# Patient Record
Sex: Female | Born: 1980 | Race: White | Hispanic: No | Smoking: Never smoker
Health system: Southern US, Community
[De-identification: ages and names within clinical notes are randomized; demographics above are authoritative.]

## PROBLEM LIST (undated history)

## (undated) DIAGNOSIS — F3181 Bipolar II disorder: Secondary | ICD-10-CM

## (undated) DIAGNOSIS — J309 Allergic rhinitis, unspecified: Secondary | ICD-10-CM

## (undated) HISTORY — DX: Allergic rhinitis, unspecified: J30.9

## (undated) HISTORY — PX: DENTAL SURGERY: SHX609

## (undated) HISTORY — DX: Bipolar II disorder: F31.81

## (undated) HISTORY — PX: OTHER SURGICAL HISTORY: SHX169

---

## 1999-09-22 ENCOUNTER — Emergency Department (HOSPITAL_COMMUNITY): Admission: EM | Admit: 1999-09-22 | Discharge: 1999-09-22 | Payer: Self-pay | Admitting: Emergency Medicine

## 1999-09-22 ENCOUNTER — Encounter: Payer: Self-pay | Admitting: Emergency Medicine

## 2009-08-02 ENCOUNTER — Encounter: Admission: RE | Admit: 2009-08-02 | Discharge: 2009-08-02 | Payer: Self-pay | Admitting: Family Medicine

## 2011-04-12 ENCOUNTER — Emergency Department: Payer: Self-pay | Admitting: Emergency Medicine

## 2011-04-12 IMAGING — CR RIGHT FOREARM - 2 VIEW
1 series · 2 of 2 positions shown · non-contrast
Comparison: none

REASON FOR EXAM: r/o FB, laceration
COMMENTS:   May transport without cardiac monitor

PROCEDURE:     DXR - DXR FOREARM RIGHT  - [DATE] [DATE]
RESULT:     Comparison: None.

[Series 1: view not recorded · 0.17mm/px · 2 of 2 slices shown]
[im 1/2]
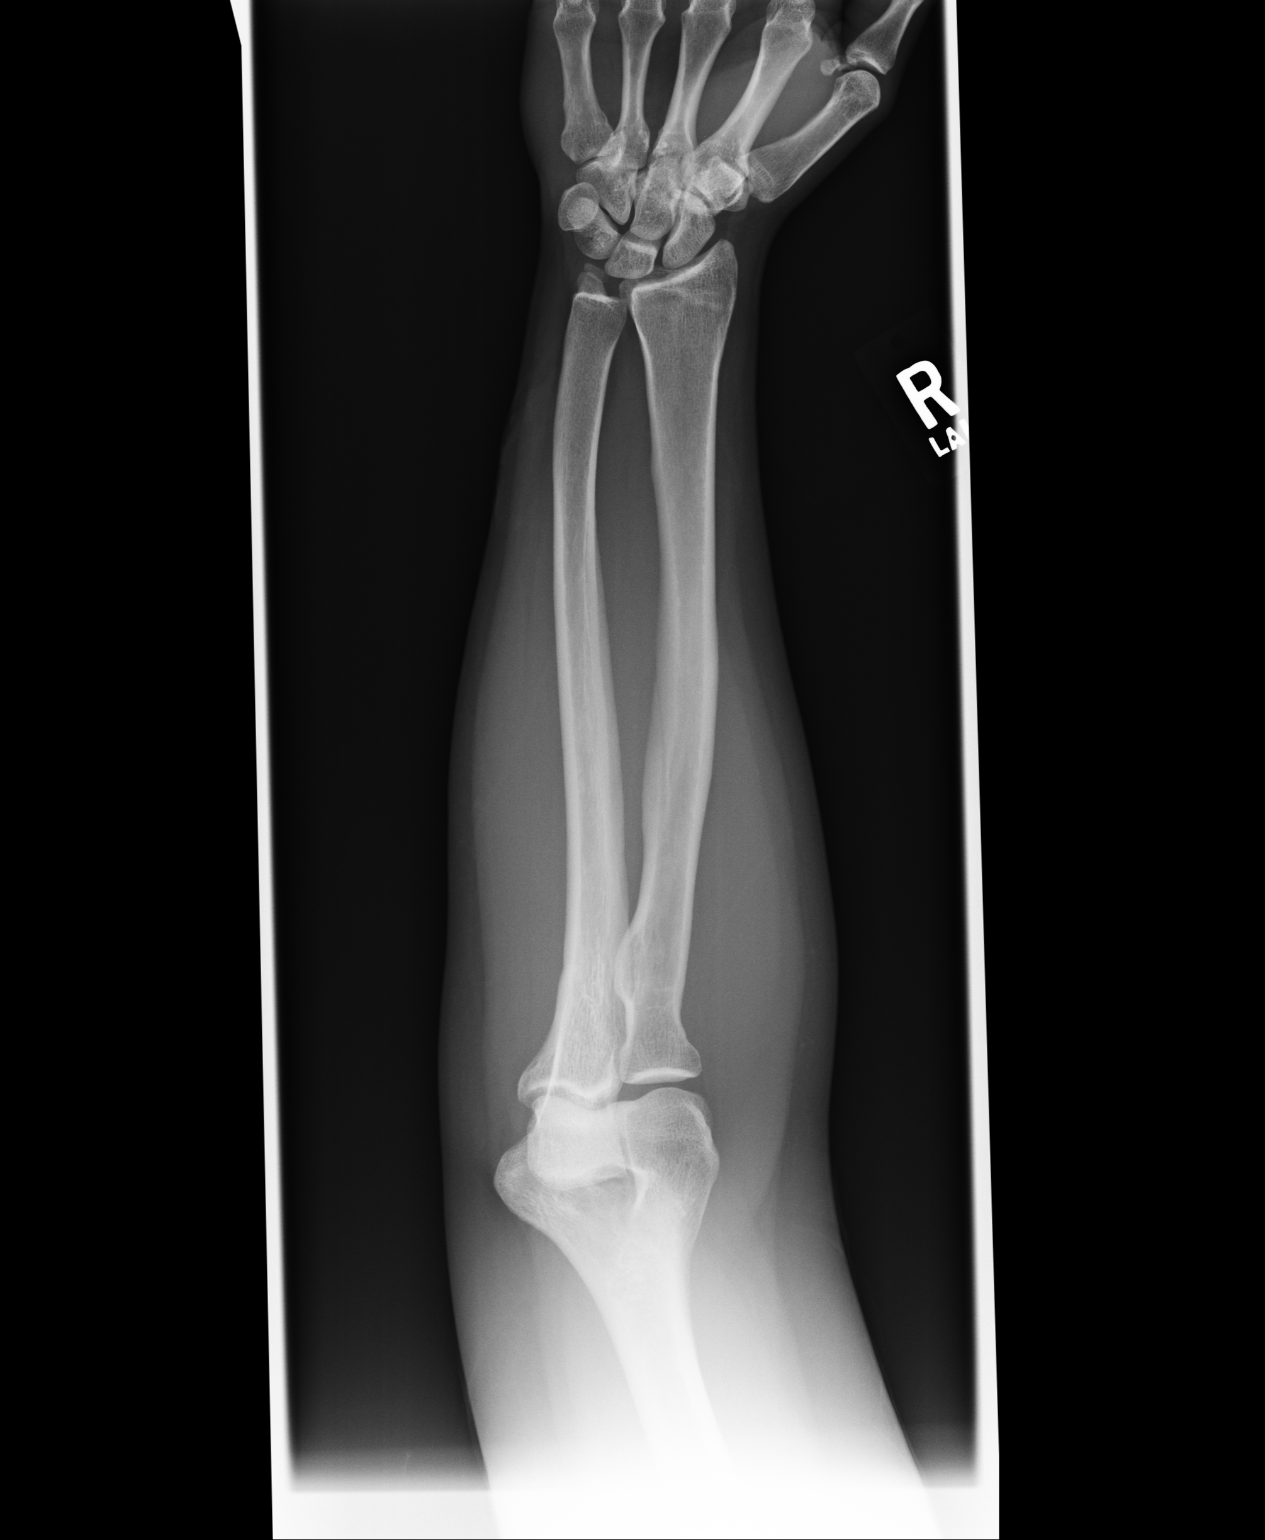
[im 2/2]
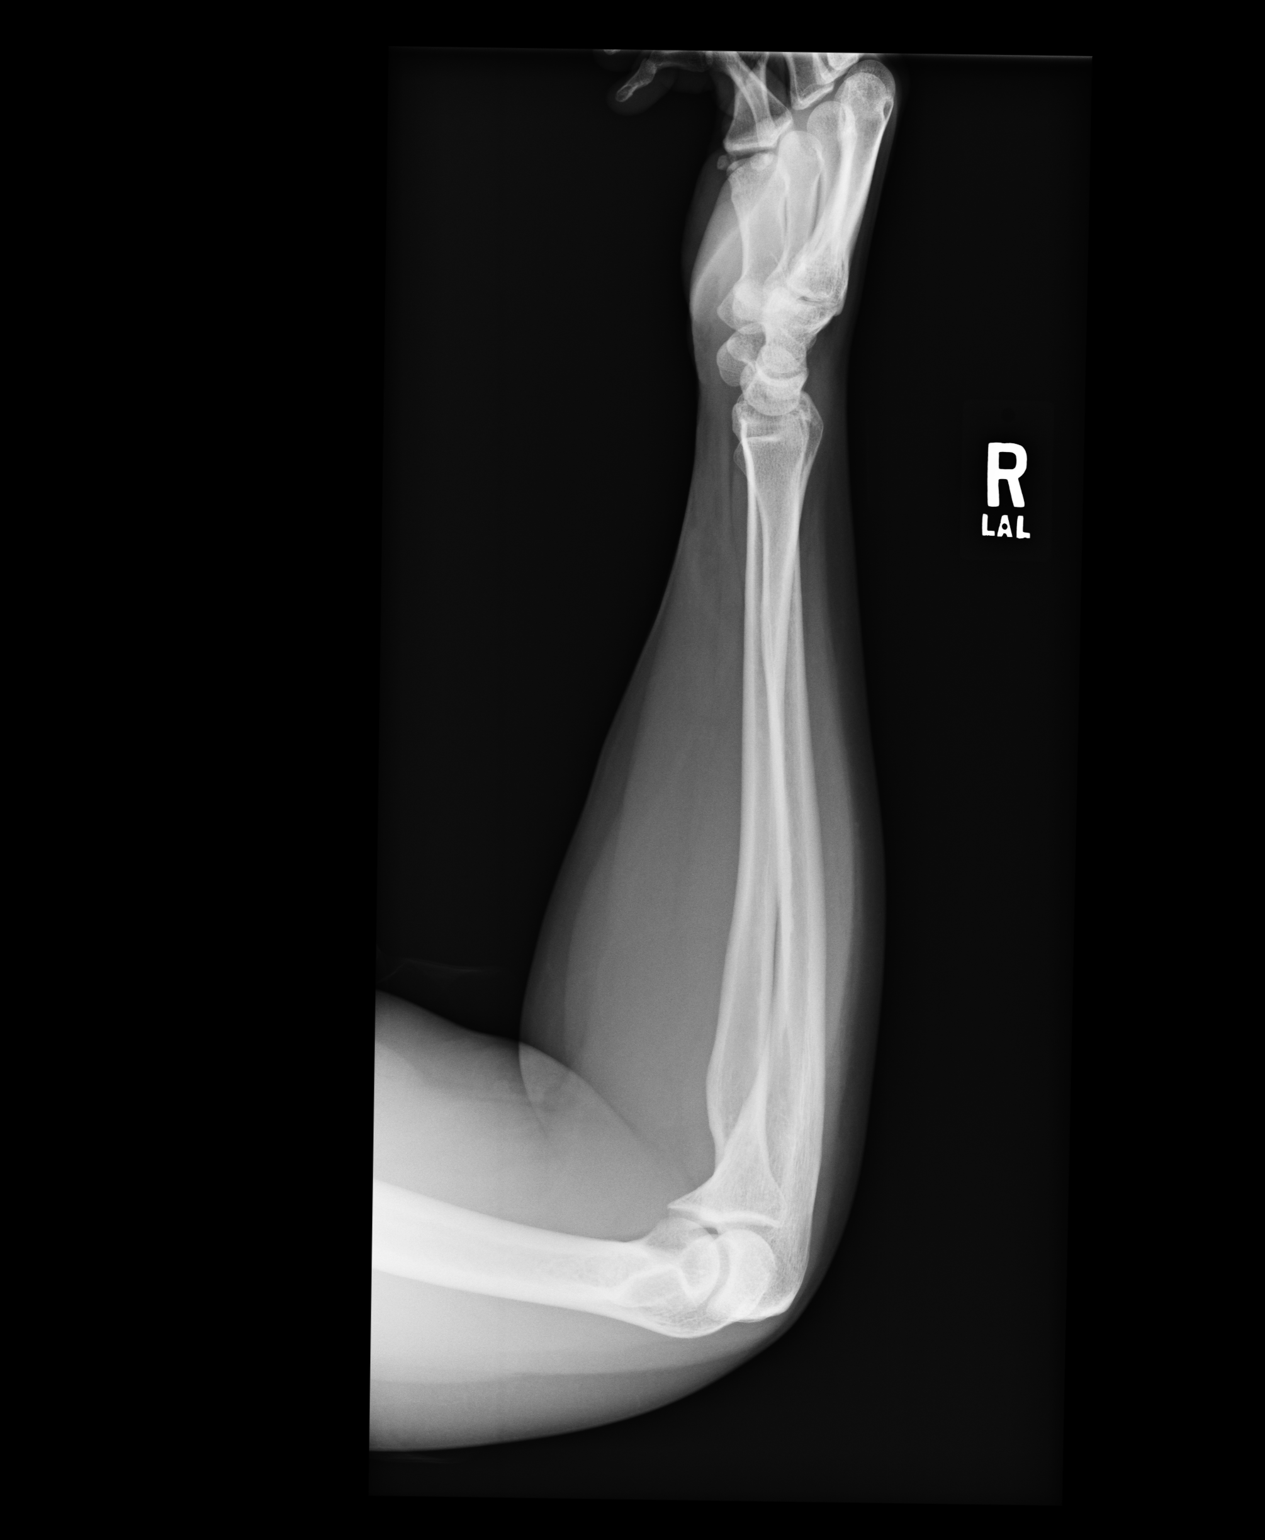

[2 of 2 positions shown; findings below may reference images not displayed]

FINDINGS: No radiopaque foreign body identified. No acute fracture seen. Round
sclerotic density distal to the ulnar styloid likely sequela of old prior
trauma.
IMPRESSION: No fracture or radiopaque foreign body identified.

## 2016-09-16 ENCOUNTER — Ambulatory Visit (INDEPENDENT_AMBULATORY_CARE_PROVIDER_SITE_OTHER): Payer: BLUE CROSS/BLUE SHIELD | Admitting: Pulmonary Disease

## 2016-09-16 ENCOUNTER — Encounter: Payer: Self-pay | Admitting: Pulmonary Disease

## 2016-09-16 VITALS — BP 122/68 | HR 72 | Ht 63.0 in | Wt 178.0 lb

## 2016-09-16 DIAGNOSIS — J45909 Unspecified asthma, uncomplicated: Secondary | ICD-10-CM | POA: Diagnosis not present

## 2016-09-16 DIAGNOSIS — R0609 Other forms of dyspnea: Secondary | ICD-10-CM

## 2016-09-16 DIAGNOSIS — R0600 Dyspnea, unspecified: Secondary | ICD-10-CM

## 2016-09-16 MED ORDER — FLUTICASONE FUROATE-VILANTEROL 100-25 MCG/INH IN AEPB: 1.0000 | INHALATION_SPRAY | Freq: Every day | RESPIRATORY_TRACT | 0 refills | Status: AC

## 2016-09-16 MED ORDER — MONTELUKAST SODIUM 10 MG PO TABS: 10.0000 mg | ORAL_TABLET | Freq: Every day | ORAL | 11 refills | Status: DC

## 2016-09-16 NOTE — Patient Instructions (Addendum)
Sample of Breo - one inhalation daily Singulair 10 mg daily until follow up Continue albuterol inhaler prior to exertion I have ordered CXR and echocardiogram Follow up in 3-4 weeks. If no better, consider cardiopulmonary stress test

## 2016-09-16 NOTE — Progress Notes (Signed)
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 She has noted DOE since age 59 or 76 and was diagnosed with exercise induced asthma. She was prescribed an albuterol inhaler but never really felt much benefit from it. 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 She indicates that no matter how hard she trains, she seems to be limited by DOE. She was on Flovent and this was changed to Advair without improvement. Therefore, she has gone back to the [employer]. She notes little day to day variation in her symptoms though she does believe her symptoms are worse in the extremes of temperature and humidity. Denies CP, fever, purulent sputum, hemoptysis, LE edema and calf tenderness.    Past Medical History:  Diagnosis Date  . Allergic rhinitis   . Asthma     Past Surgical History:  Procedure Laterality Date  . birth mark removal    . DENTAL SURGERY      MEDICATIONS: I have reviewed all medications and confirmed regimen as documented  Social History   Social History  . Marital status: Married    Spouse name: N/A  . Number of children: N/A  . Years of education: N/A   Occupational History  . Not on file.   Social History Main Topics  . Smoking status: Never Smoker  . Smokeless tobacco: Never Used  . Alcohol use Yes     Comment: occ  . Drug use: No  . Sexual activity: Not on file   Other Topics Concern  . Not on file   Social History Narrative  . No narrative on file    Family History  Problem Relation Age of Onset  . Hypertension Mother   . Hypertension Father   . Breast cancer Maternal Grandmother   . Heart disease Paternal  Grandmother     ROS: No fever, myalgias/arthralgias, unexplained weight loss or weight gain No new focal weakness or sensory deficits No otalgia, hearing loss, visual changes, nasal and sinus symptoms, mouth and throat problems No neck pain or adenopathy No abdominal pain, N/V/D, diarrhea, change in bowel pattern No dysuria, change in urinary pattern   Vitals:   09/16/16 1002  BP: 122/68  Pulse: 72  SpO2: 100%  Weight: 178 lb (80.7 kg)  Height: 5\' 3"  (1.6 m)     EXAM:  Gen: WDWN, No overt respiratory distress HEENT: NCAT, sclera white, oropharynx normal Neck: Supple without LAN, thyromegaly, JVD Lungs: breath sounds: full, percussion: normal, No wheezes or other adventitious sounds Cardiovascular: RRR, no murmurs Abdomen: Soft, nontender, normal BS Ext: without clubbing, cyanosis, edema Neuro: CNs grossly intact, motor and sensory intact Skin: Limited exam, no lesions noted  DATA:  Outside CBC and chem panels reviewed - entirely normal  CXR:  N/A Office spirometry 09/16/16: entirely normal  IMPRESSION:     ICD-9-CM ICD-10-CM   1. DOE (dyspnea on exertion) 786.09 R06.09 Spirometry with graph     DG Chest 2 View  2. Asthma. possible 493.90 J45.909    The lack of response to proper asthma therapy calls into question the accuracy of that diagnosis. If this is exercise induced asthma, it should respond to ICS/LABA. Leukotriene inhibitors are  also effective in EIA  PLAN:  1) Breo 100/25 - one inhalation daily. Samples provided 2) Singulair 10 mg daily until follow up 3) Continue albuterol inhaler prior to exertion 4) I have ordered CXR and echocardiogram 5) Follow up in 3-4 weeks. If no better, consider cardiopulmonary stress test   Merton Border, MD PCCM service Mobile [phone] Pager [phone] 09/16/2016

## 2016-09-16 NOTE — Progress Notes (Signed)
Patient ID: Marilyn Francis, female   DOB: 08/24/1981, 35 y.o.   MRN: [id number] Patient seen in the office today and instructed on use of Breo Ellipta.  Patient expressed understanding and demonstrated technique.

## 2016-09-18 ENCOUNTER — Telehealth: Payer: Self-pay | Admitting: Pulmonary Disease

## 2016-09-18 NOTE — Telephone Encounter (Signed)
Pt would like a call regarding a comment that Dr. Alva Garnet documented in her note regarding her working. Please call.

## 2016-09-18 NOTE — Telephone Encounter (Signed)
Spoke with pt who states after her OV with DS on 09-16-16 a note was sent to her job and because of how it was worded, pt has been placed on restricted duty and would like to discuss this further with DS. Pt has been scheduled for 09-22-16 at 9am  With DS. Nothing further needed.  Will route to DS for a fyi.

## 2016-09-22 ENCOUNTER — Ambulatory Visit
Admission: RE | Admit: 2016-09-22 | Discharge: 2016-09-22 | Disposition: A | Discharge status: Home / Self Care | Payer: BLUE CROSS/BLUE SHIELD | Source: Ambulatory Visit | Attending: Pulmonary Disease | Admitting: Pulmonary Disease

## 2016-09-22 ENCOUNTER — Ambulatory Visit (INDEPENDENT_AMBULATORY_CARE_PROVIDER_SITE_OTHER): Payer: BLUE CROSS/BLUE SHIELD | Admitting: Pulmonary Disease

## 2016-09-22 ENCOUNTER — Encounter: Payer: Self-pay | Admitting: Pulmonary Disease

## 2016-09-22 VITALS — BP 130/70 | HR 78 | Ht 63.0 in | Wt 185.8 lb

## 2016-09-22 DIAGNOSIS — R05 Cough: Secondary | ICD-10-CM

## 2016-09-22 DIAGNOSIS — R0609 Other forms of dyspnea: Secondary | ICD-10-CM | POA: Diagnosis not present

## 2016-09-22 DIAGNOSIS — R0600 Dyspnea, unspecified: Secondary | ICD-10-CM

## 2016-09-22 DIAGNOSIS — R059 Cough, unspecified: Secondary | ICD-10-CM

## 2016-09-22 IMAGING — CR DG CHEST 2V
1 series · 2 of 2 positions shown · non-contrast
Comparison: None.

CLINICAL DATA: Shortness of breath with exertion

EXAM:
CHEST  2 VIEW

[Series 1: dg chest 2 view · 0.14mm/px · 2 of 2 slices shown]
[im 1/2]
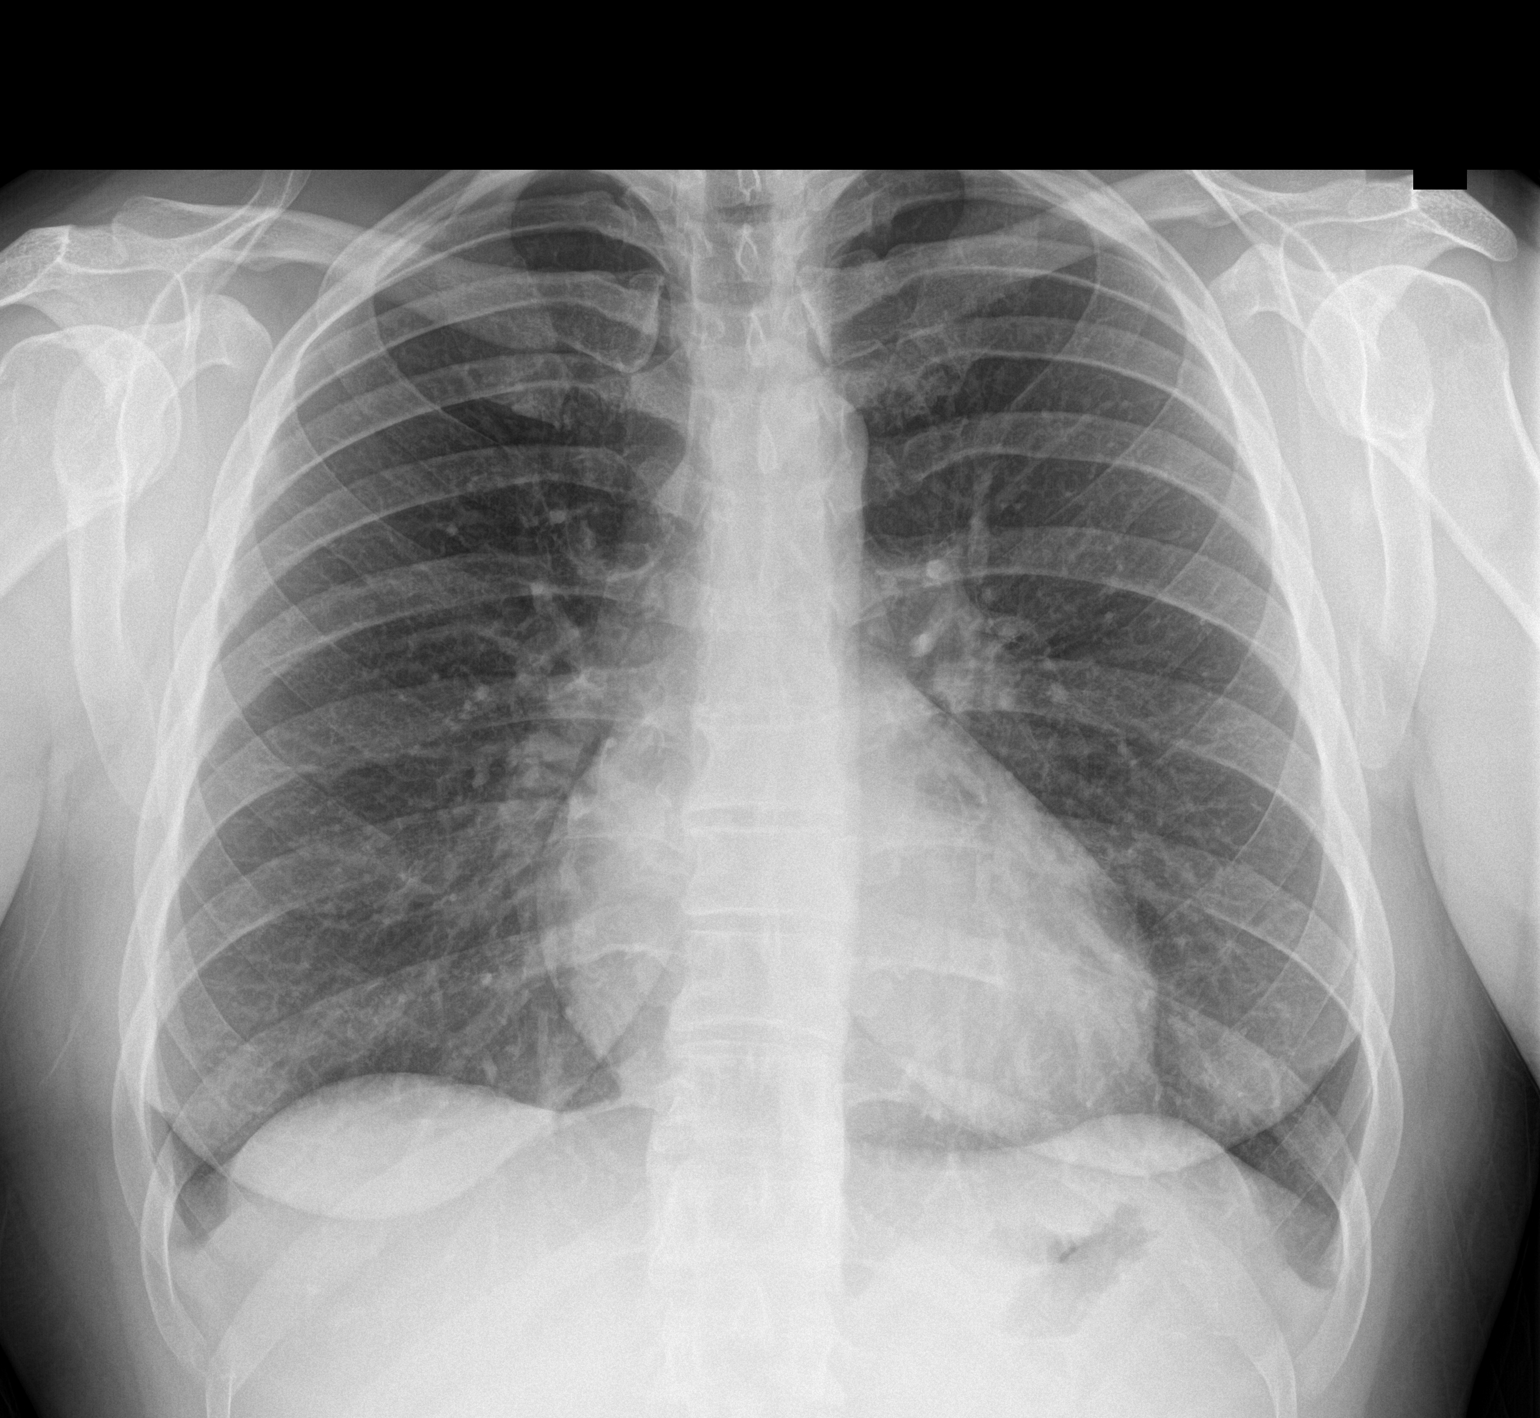
[im 2/2]
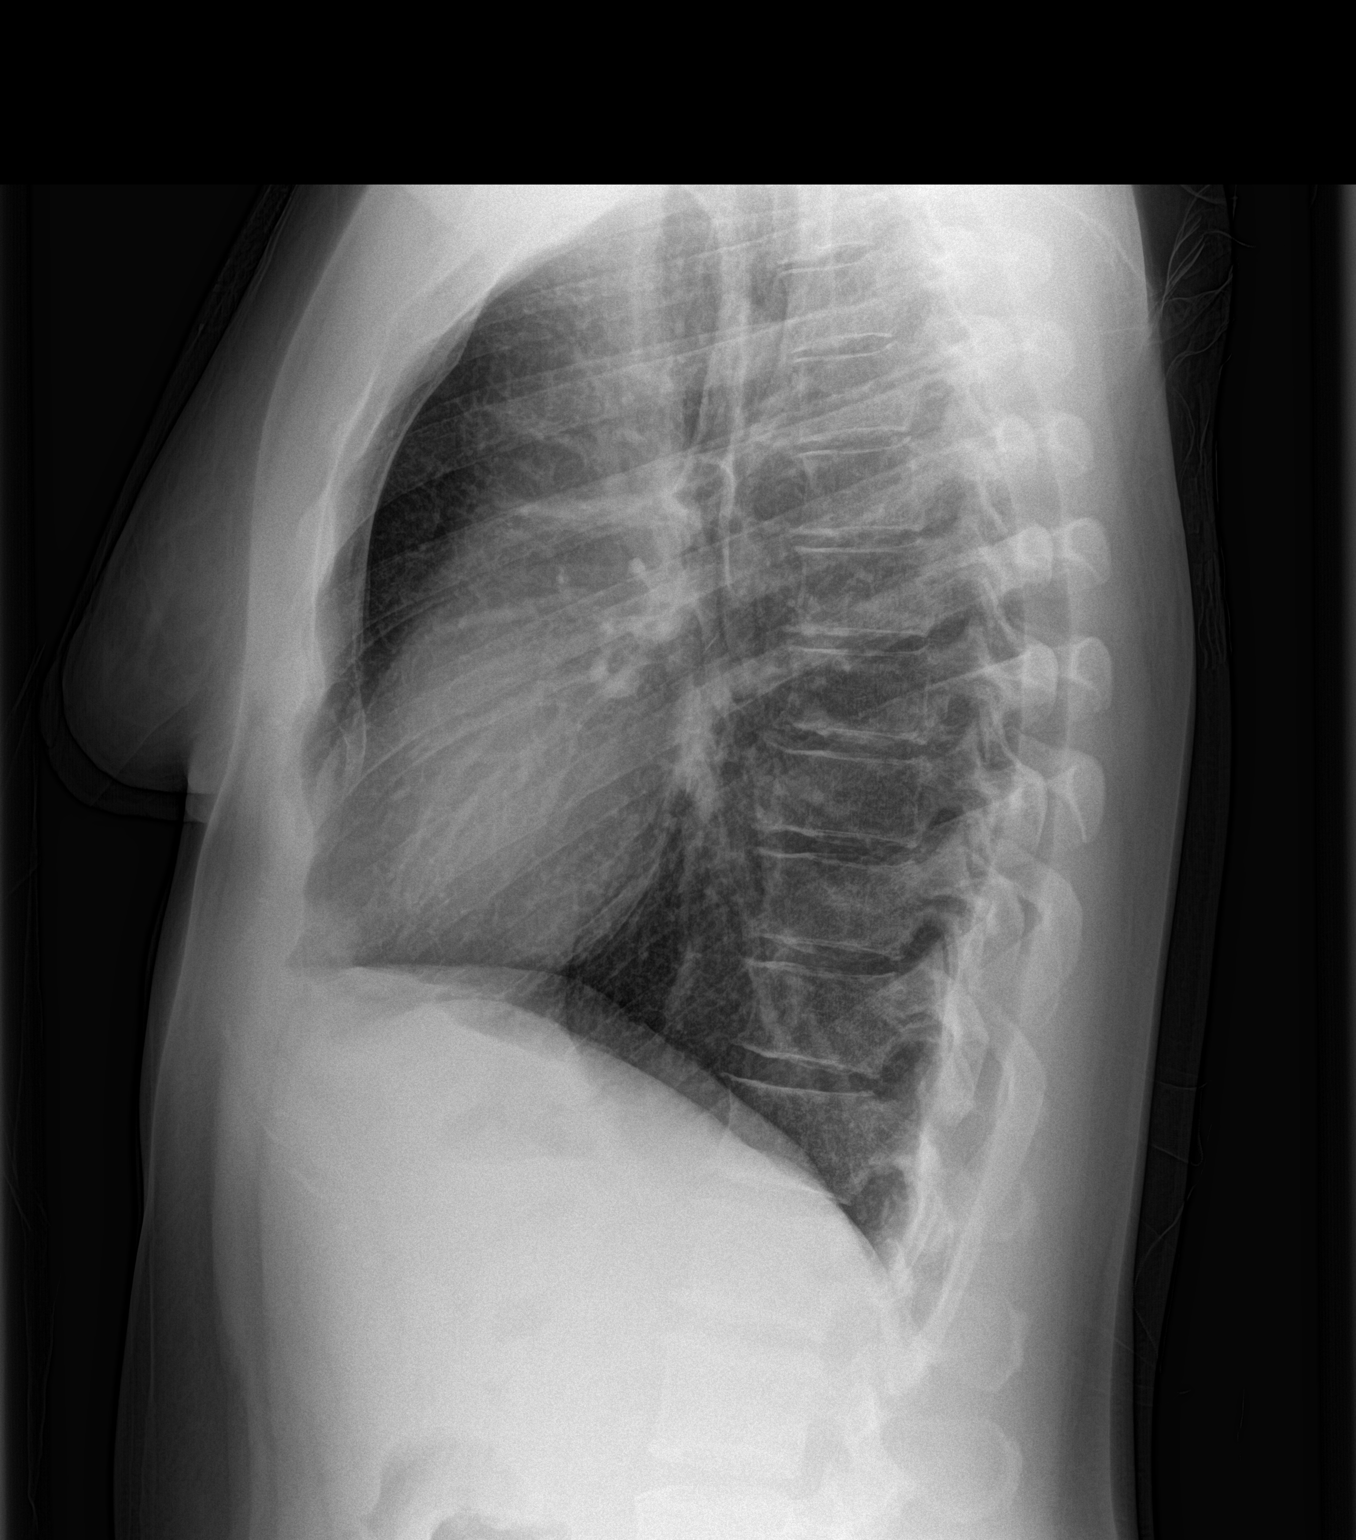

[2 of 2 positions shown; findings below may reference images not displayed]

FINDINGS: Lungs are clear. Heart size and pulmonary vascularity are normal. No
adenopathy. No bone lesions.
IMPRESSION: No edema or consolidation.

## 2016-09-22 NOTE — Progress Notes (Signed)
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 She has been treated with inhaled SABA but there has been minimal discernible benefit. 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   Office spirometry 09/16/16: entirely normal  SUBJ: Seen as an add-on today. She is concerned about the wording in my last note and reports that she has been on restricted duty due to the possible diagnosis of asthma and the notation re: limiting dyspnea. She emphasizes to me that she does not believe that her symptoms are preventing her from completing any of her duties. She has noted no change in her symptoms since starting Breo inhaler and montelukast. Denies CP, fever, purulent sputum, hemoptysis, LE edema and calf tenderness.  Vitals:   09/22/16 0848  BP: 130/70  Pulse: 78  SpO2: 98%  Weight: 185 lb 12.8 oz (84.3 kg)  Height: 5\' 3"  (1.6 m)     EXAM:  Gen: WDWN, No overt respiratory distress Neck: No, JVD Lungs: breath sounds full, no wheezes or other adventitious sounds Cardiovascular: RRR, no murmurs Abdomen: Soft, nontender, normal BS Ext: without clubbing, cyanosis, edema Neuro: grossly intact Skin: Limited exam, no lesions noted  DATA:   CXR:  pending   IMPRESSION:     ICD-9-CM ICD-10-CM   1. DOE (dyspnea on exertion) 786.09 R06.09 Cardiopulmonary exercise test  2. Cough due to ICS/LABA (Breo)  Exertional dyspnea remains incompletely explained. No discernible benefit from Breo inhaler and Singulair  PLAN:  1) DC Breo  2) Cont Singulair 10 mg daily until follow up 3) Continue albuterol inhaler prior to exertion 4) Previously ordered  CXR to be performed today 5) Echocardiogram already scheduled for 10/01/16 6) Cardiopulmonary stress test ordered with pre and post spirometry. This will measure her VO2 max, clarify the major limiting factor and assess for exercise induced bronchospasm 7) ROV already scheduled for 10/10/16  Merton Border, MD PCCM service Mobile [phone] Pager [phone] 09/22/2016

## 2016-09-22 NOTE — Patient Instructions (Signed)
1) Stop Breo inhaler 2) Continue Singulair (montelukast) 3) Continue Albuterol inhaler as needed and prior to vigorous exertion 4) Echocardiogram scheduled for 10/01/16 5) Cardiopulmonary stress test ([place]) 6) Chest Xray today 7) Follow up already scheduled for 10/10/16

## 2016-10-01 ENCOUNTER — Other Ambulatory Visit: Payer: Self-pay

## 2016-10-01 ENCOUNTER — Ambulatory Visit (INDEPENDENT_AMBULATORY_CARE_PROVIDER_SITE_OTHER): Payer: BLUE CROSS/BLUE SHIELD

## 2016-10-01 DIAGNOSIS — R0600 Dyspnea, unspecified: Secondary | ICD-10-CM

## 2016-10-01 DIAGNOSIS — R0609 Other forms of dyspnea: Secondary | ICD-10-CM

## 2016-10-01 LAB — ECHOCARDIOGRAM COMPLETE
AVLVOTPG: 6 mmHg
Ao-asc: 26 cm
CHL CUP DOP CALC LVOT VTI: 28 cm
CHL CUP MV DEC (S): 254
CHL CUP STROKE VOLUME: 67 mL
EERAT: 10.57
EWDT: 254 ms
FS: 41 % (ref 28–44)
IVS/LV PW RATIO, ED: 1.26
LA ID, A-P, ES: 35 mm
LA vol A4C: 52.5 ml
LA vol: 44.9 mL
LADIAMINDEX: 1.78 cm/m2
LAVOLIN: 22.8 mL/m2
LEFT ATRIUM END SYS DIAM: 35 mm
LV PW d: 8.65 mm — AB (ref 0.6–1.1)
LV SIMPSON'S DISK: 66
LV TDI E'MEDIAL: 10.9
LV dias vol index: 51 mL/m2
LV dias vol: 101 mL (ref 46–106)
LV e' LATERAL: 12.3 cm/s
LV sys vol index: 17 mL/m2
LVEEAVG: 10.57
LVEEMED: 10.57
LVOT area: 3.14 cm2
LVOT diameter: 20 mm
LVOTPV: 124 cm/s
LVOTSV: 88 mL
LVSYSVOL: 34 mL (ref 14–42)
MV pk A vel: 59.7 m/s
MVPG: 7 mmHg
MVPKEVEL: 130 m/s
TAPSE: 25.2 mm
TDI e' lateral: 12.3

## 2016-10-03 ENCOUNTER — Ambulatory Visit (HOSPITAL_COMMUNITY): Payer: BLUE CROSS/BLUE SHIELD | Attending: Pulmonary Disease

## 2016-10-03 DIAGNOSIS — R0609 Other forms of dyspnea: Secondary | ICD-10-CM | POA: Insufficient documentation

## 2016-10-03 DIAGNOSIS — R0600 Dyspnea, unspecified: Secondary | ICD-10-CM

## 2016-10-10 ENCOUNTER — Encounter: Payer: Self-pay | Admitting: Pulmonary Disease

## 2016-10-10 ENCOUNTER — Ambulatory Visit (INDEPENDENT_AMBULATORY_CARE_PROVIDER_SITE_OTHER): Payer: BLUE CROSS/BLUE SHIELD | Admitting: Pulmonary Disease

## 2016-10-10 VITALS — BP 118/68 | HR 73 | Ht 63.0 in | Wt 183.0 lb

## 2016-10-10 DIAGNOSIS — R0600 Dyspnea, unspecified: Secondary | ICD-10-CM | POA: Diagnosis not present

## 2016-10-10 DIAGNOSIS — R0609 Other forms of dyspnea: Secondary | ICD-10-CM

## 2016-10-12 NOTE — Progress Notes (Signed)
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 She has been treated with inhaled SABA but there has been minimal discernible benefit. 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   Office spirometry 09/16/16: entirely normal CXR 09/22/16: normal Echocardiogram 10/01/16:  LVEF 60-65%, mild MR, mild TR CPST 10/03/16: Supranormal exercise tolerance (VO2 max 136% predicted), normal cardiopulmonary response to exercise. No exercise induced bronchospasm  SUBJ: No new complaints. Here to review results of echocardiogram and CPST  Vitals:   10/10/16 1016  BP: 118/68  Pulse: 73  SpO2: 100%  Weight: 183 lb (83 kg)  Height: 5\' 3"  (1.6 m)     EXAM:  Gen: WDWN, No overt respiratory distress Neck: No, JVD Lungs: breath sounds full Cardiovascular: RRR, no murmurs Abdomen: Soft, nontender, normal BS Ext: without clubbing, cyanosis, edema Neuro: grossly intact Skin: Limited exam, no lesions noted  DATA:   CXR:    IMPRESSION:   Unexplained dyspnea - a thorough investigation reveals no evidence of cardiac or pulmonary disease or impairment. There is no evidence of asthma or exercise-induced asthma. Her exercise tolerance is supra-normal and her cardiopulmonary response to exercise is normal  She is frustrated with the lack of a diagnosis. Her symptoms are real but without explanation.   PLAN:  I explained that we have not uncovered an explanation for her symptoms. It is highly unlikely that further investigation would be any more enlightening, especially in light of her supra-normal VO2 max. There is no indication to continue any of the  trial therapies (that have done nothing to alleviate her symptoms). There is no medical reason for any restrictions in the course of her work or other activities  Follow up PRN  Merton Border, MD PCCM service Mobile [phone] Pager [phone] 10/12/2016

## 2017-03-27 ENCOUNTER — Ambulatory Visit (INDEPENDENT_AMBULATORY_CARE_PROVIDER_SITE_OTHER): Payer: BLUE CROSS/BLUE SHIELD | Admitting: Family Medicine

## 2017-03-27 ENCOUNTER — Encounter: Payer: Self-pay | Admitting: Family Medicine

## 2017-03-27 VITALS — BP 109/71 | HR 60 | Temp 98.3°F | Ht 63.1 in | Wt 182.4 lb

## 2017-03-27 DIAGNOSIS — Z1329 Encounter for screening for other suspected endocrine disorder: Secondary | ICD-10-CM | POA: Diagnosis not present

## 2017-03-27 DIAGNOSIS — F3181 Bipolar II disorder: Secondary | ICD-10-CM | POA: Diagnosis not present

## 2017-03-27 DIAGNOSIS — Z79899 Other long term (current) drug therapy: Secondary | ICD-10-CM | POA: Diagnosis not present

## 2017-03-27 DIAGNOSIS — J309 Allergic rhinitis, unspecified: Secondary | ICD-10-CM | POA: Insufficient documentation

## 2017-03-27 DIAGNOSIS — Z13220 Encounter for screening for lipoid disorders: Secondary | ICD-10-CM

## 2017-03-27 MED ORDER — LAMOTRIGINE 100 MG PO TABS: 100.0000 mg | ORAL_TABLET | Freq: Every day | ORAL | 1 refills | Status: DC

## 2017-03-27 NOTE — Progress Notes (Signed)
BP 109/71 (BP Location: Left Arm, Patient Position: Sitting, Cuff Size: Large)   Pulse 60   Temp 98.3 F (36.8 C)   Ht 5' 3.1" (1.603 m)   Wt 182 lb 6.4 oz (82.7 kg)   LMP 03/15/2017 (Exact Date)   SpO2 97%   BMI 32.21 kg/m    Subjective:    Patient ID: Marilyn Francis, female    DOB: 08-27-1981, 36 y.o.   MRN: [id number]  HPI: Marilyn Francis is a 36 y.o. female who presents today to establish care  Chief Complaint  Patient presents with  . Establish Care  . Medication Refill    Lamactil, patient states that her previous mental health provider has moved away so she has to find a new one. She is currently stable on her medication and would like a refill on this.    BIPOLAR- Diagnosed in 2005, Has been on stable dose Mood status: controlled Satisfied with current treatment?: yes Symptom severity: mild  Duration of current treatment : months Side effects: no Medication compliance: excellent compliance Psychotherapy/counseling: yes in the past Previous psychiatric medications: lamictal Depressed mood: no Anxious mood: no Anhedonia: no Significant weight loss or gain: no Insomnia: no  Fatigue: no Feelings of worthlessness or guilt: no Impaired concentration/indecisiveness: no Suicidal ideations: no Hopelessness: no Crying spells: no Depression screen PHQ 2/9 03/27/2017  Decreased Interest 0  Down, Depressed, Hopeless 0  PHQ - 2 Score 0   Allergies stable- having some sniffling. Using Neti-pot  Active Ambulatory Problems    Diagnosis Date Noted  . Bipolar 2 disorder ([place])   . Allergic rhinitis    Resolved Ambulatory Problems    Diagnosis Date Noted  . No Resolved Ambulatory Problems   Past Medical History:  Diagnosis Date  . Allergic rhinitis   . Bipolar 2 disorder [facility])    Past Surgical History:  Procedure Laterality Date  . birth mark removal    . DENTAL SURGERY     Outpatient Encounter Prescriptions as of 03/27/2017  Medication Sig Note  .  Calcium Carbonate-Vit D-Min (CALCIUM 1200 PO) Take by mouth.   . cholecalciferol (VITAMIN D) 1000 units tablet Take 1,000 Units by mouth daily.   . cetirizine (ZYRTEC) 10 MG tablet Take 10 mg by mouth daily.   Marland Kitchen ibuprofen (ADVIL,MOTRIN) 600 MG tablet Take 600 mg by mouth every 6 (six) hours as needed.   . lamoTRIgine (LAMICTAL) 100 MG tablet Take 1 tablet (100 mg total) by mouth daily.   . Magnesium 200 MG TABS Take 1 tablet by mouth daily.   . Multiple Vitamin (MULTIVITAMIN) tablet Take 1 tablet by mouth daily.   . [DISCONTINUED] lamoTRIgine (LAMICTAL) 100 MG tablet  09/16/2016: Received from: External Pharmacy  . [DISCONTINUED] montelukast (SINGULAIR) 10 MG tablet Take 1 tablet (10 mg total) by mouth daily.    No facility-administered encounter medications on file as of 03/27/2017.    No Known Allergies Social History   Social History  . Marital status: Married    Spouse name: N/A  . Number of children: N/A  . Years of education: N/A   Occupational History  . Not on file.   Social History Main Topics  . Smoking status: Never Smoker  . Smokeless tobacco: Never Used  . Alcohol use Yes     Comment: occ  . Drug use: No  . Sexual activity: Yes    Birth control/ protection: None   Other Topics Concern  . Not on file   Social History  Narrative  . No narrative on file   Family History  Problem Relation Age of Onset  . Hypertension Mother   . Hypertension Father   . Breast cancer Maternal Grandmother   . Heart disease Paternal Grandmother   . Cancer Paternal Grandfather     Colon    Review of Systems  Constitutional: Negative.   HENT: Positive for postnasal drip, rhinorrhea and sneezing. Negative for congestion, dental problem, drooling, ear discharge, ear pain, facial swelling, hearing loss, mouth sores, nosebleeds, sinus pain, sinus pressure, sore throat, tinnitus, trouble swallowing and voice change.   Respiratory: Negative.   Cardiovascular: Negative.     Psychiatric/Behavioral: Negative.     Per HPI unless specifically indicated above     Objective:    BP 109/71 (BP Location: Left Arm, Patient Position: Sitting, Cuff Size: Large)   Pulse 60   Temp 98.3 F (36.8 C)   Ht 5' 3.1" (1.603 m)   Wt 182 lb 6.4 oz (82.7 kg)   LMP 03/15/2017 (Exact Date)   SpO2 97%   BMI 32.21 kg/m   Wt Readings from Last 3 Encounters:  03/27/17 182 lb 6.4 oz (82.7 kg)  10/10/16 183 lb (83 kg)  09/22/16 185 lb 12.8 oz (84.3 kg)    Physical Exam  Constitutional: She is oriented to person, place, and time. She appears well-developed and well-nourished. No distress.  HENT:  Head: Normocephalic and atraumatic.  Right Ear: Hearing normal.  Left Ear: Hearing normal.  Nose: Nose normal.  Eyes: Conjunctivae and lids are normal. Right eye exhibits no discharge. Left eye exhibits no discharge. No scleral icterus.  Cardiovascular: Normal rate, regular rhythm, normal heart sounds and intact distal pulses.  Exam reveals no gallop and no friction rub.   No murmur heard. Pulmonary/Chest: Effort normal and breath sounds normal. No respiratory distress. She has no wheezes. She has no rales. She exhibits no tenderness.  Musculoskeletal: Normal range of motion.  Neurological: She is alert and oriented to person, place, and time.  Skin: Skin is warm, dry and intact. No rash noted. She is not diaphoretic. No erythema. No pallor.  Psychiatric: She has a normal mood and affect. Her speech is normal and behavior is normal. Judgment and thought content normal. Cognition and memory are normal.  Nursing note and vitals reviewed.   Results for orders placed or performed in visit on 10/01/16  ECHOCARDIOGRAM COMPLETE  Result Value Ref Range   LV PW d 8.65 (A) 0.6 - 1.1 mm   FS 41 28 - 44 %   LA vol 44.9 mL   Ao-asc 26 cm   LA ID, A-P, ES 35 mm   IVS/LV PW RATIO, ED 1.26    Stroke v 67 ml   LVOT VTI 28 cm   LV e' LATERAL 12.3 cm/s   LV E/e' medial 10.57    LV  E/e'average 10.57    LA diam index 1.78 cm/m2   LA vol A4C 52.5 ml   LVOT peak grad rest 6 mmHg   E decel time 254 msec   LVOT diameter 20 mm   LVOT area 3.14 cm2   LVOT peak vel 124 cm/s   LVOT SV 88.00 mL   Peak grad 7 mmHg   E/e' ratio 10.57    MV pk E vel 130 m/s   MV pk A vel 59.7 m/s   LV sys vol 34 14 - 42 mL   LV sys vol index 17.0 mL/m2   LV dias  vol 101 46 - 106 mL   LV dias vol index 51.0 mL/m2   LA vol index 22.8 mL/m2   MV Dec 254    LA diam end sys 35.00 mm   Simpson's disk 66.00    TDI e' medial 10.90    TDI e' lateral 12.30    TAPSE 25.20 mm      Assessment & Plan:   Problem List Items Addressed This Visit      Respiratory   Allergic rhinitis    Stable on current regimen. Does not want to take flonase right now. Call with any concerns.         Other   Bipolar 2 disorder ([place]) - Primary    Well controlled. Will bridge her medicine now and check lamictal levels. Referral to Dr. Nicolasa Ducking put in today.      Relevant Orders   Lamotrigine level    Other Visit Diagnoses    Long-term use of high-risk medication       Lamictal levels drawn today.   Relevant Orders   CBC with Differential/Platelet   Comprehensive metabolic panel   TSH   UA/M w/rflx Culture, Routine   Lamotrigine level   Screening for thyroid disorder       Checking labs. Await results.   Relevant Orders   TSH   Screening for cholesterol level       Checking labs. Await results.   Relevant Orders   Lipid Panel w/o Chol/HDL Ratio       Follow up plan: Return Physical, for Records release [facility] for pap please.

## 2017-03-27 NOTE — Assessment & Plan Note (Signed)
Well controlled. Will bridge her medicine now and check lamictal levels. Referral to Dr. Nicolasa Ducking put in today.

## 2017-03-27 NOTE — Assessment & Plan Note (Signed)
Stable on current regimen. Does not want to take flonase right now. Call with any concerns.

## 2017-03-30 ENCOUNTER — Encounter: Payer: Self-pay | Admitting: Family Medicine

## 2017-03-30 ENCOUNTER — Ambulatory Visit (INDEPENDENT_AMBULATORY_CARE_PROVIDER_SITE_OTHER): Payer: BLUE CROSS/BLUE SHIELD | Admitting: Family Medicine

## 2017-03-30 VITALS — BP 125/82 | HR 64 | Temp 98.4°F | Wt 184.4 lb

## 2017-03-30 DIAGNOSIS — Z0000 Encounter for general adult medical examination without abnormal findings: Secondary | ICD-10-CM | POA: Diagnosis not present

## 2017-03-30 DIAGNOSIS — Z124 Encounter for screening for malignant neoplasm of cervix: Secondary | ICD-10-CM

## 2017-03-30 LAB — LIPID PANEL W/O CHOL/HDL RATIO
CHOLESTEROL TOTAL: 173 mg/dL (ref 100–199)
HDL: 79 mg/dL (ref >39)
LDL Calculated: 81 mg/dL (ref 0–99)
TRIGLYCERIDES: 63 mg/dL (ref 0–149)
VLDL CHOLESTEROL CAL: 13 mg/dL (ref 5–40)

## 2017-03-30 LAB — UA/M W/RFLX CULTURE, ROUTINE
Bilirubin, UA: NEGATIVE
Glucose, UA: NEGATIVE
Ketones, UA: NEGATIVE
Leukocytes, UA: NEGATIVE
NITRITE UA: NEGATIVE
PH UA: 7 (ref 5.0–7.5)
Protein, UA: NEGATIVE
RBC UA: NEGATIVE
Specific Gravity, UA: 1.02 (ref 1.005–1.030)
UUROB: 0.2 mg/dL (ref 0.2–1.0)

## 2017-03-30 LAB — CBC WITH DIFFERENTIAL/PLATELET
BASOS ABS: 0 10*3/uL (ref 0.0–0.2)
Basos: 0 %
EOS (ABSOLUTE): 0.2 10*3/uL (ref 0.0–0.4)
Eos: 2 %
Hematocrit: 39 % (ref 34.0–46.6)
Hemoglobin: 13.2 g/dL (ref 11.1–15.9)
IMMATURE GRANS (ABS): 0 10*3/uL (ref 0.0–0.1)
Immature Granulocytes: 0 %
LYMPHS: 27 %
Lymphocytes Absolute: 1.7 10*3/uL (ref 0.7–3.1)
MCH: 31.7 pg (ref 26.6–33.0)
MCHC: 33.8 g/dL (ref 31.5–35.7)
MCV: 94 fL (ref 79–97)
Monocytes Absolute: 0.3 10*3/uL (ref 0.1–0.9)
Monocytes: 5 %
Neutrophils Absolute: 4.1 10*3/uL (ref 1.4–7.0)
Neutrophils: 66 %
PLATELETS: 209 10*3/uL (ref 150–379)
RBC: 4.17 x10E6/uL (ref 3.77–5.28)
RDW: 13.3 % (ref 12.3–15.4)
WBC: 6.2 10*3/uL (ref 3.4–10.8)

## 2017-03-30 LAB — COMPREHENSIVE METABOLIC PANEL
A/G RATIO: 2.3 — AB (ref 1.2–2.2)
ALT: 8 IU/L (ref 0–32)
AST: 19 IU/L (ref 0–40)
Albumin: 4.3 g/dL (ref 3.5–5.5)
Alkaline Phosphatase: 47 IU/L (ref 39–117)
BILIRUBIN TOTAL: 0.2 mg/dL (ref 0.0–1.2)
BUN/Creatinine Ratio: 17 (ref 9–23)
BUN: 18 mg/dL (ref 6–20)
CHLORIDE: 103 mmol/L (ref 96–106)
CO2: 25 mmol/L (ref 18–29)
Calcium: 9.4 mg/dL (ref 8.7–10.2)
Creatinine, Ser: 1.06 mg/dL — ABNORMAL HIGH (ref 0.57–1.00)
GFR calc non Af Amer: 68 mL/min/{1.73_m2} (ref >59)
GFR, EST AFRICAN AMERICAN: 78 mL/min/{1.73_m2} (ref >59)
GLUCOSE: 92 mg/dL (ref 65–99)
Globulin, Total: 1.9 g/dL (ref 1.5–4.5)
Potassium: 4.7 mmol/L (ref 3.5–5.2)
Sodium: 140 mmol/L (ref 134–144)
TOTAL PROTEIN: 6.2 g/dL (ref 6.0–8.5)

## 2017-03-30 LAB — MICROSCOPIC EXAMINATION: WBC UA: NONE SEEN /HPF (ref 0–?)

## 2017-03-30 LAB — TSH: TSH: 2.79 u[IU]/mL (ref 0.450–4.500)

## 2017-03-30 LAB — URINE CULTURE, REFLEX

## 2017-03-30 LAB — LAMOTRIGINE LEVEL: LAMOTRIGINE LVL: 1.5 ug/mL — AB (ref 2.0–20.0)

## 2017-03-30 NOTE — Patient Instructions (Addendum)
Health Maintenance, Female Adopting a healthy lifestyle and getting preventive care can go a long way to promote health and wellness. Talk with your health care provider about what schedule of regular examinations is right for you. This is a good chance for you to check in with your provider about disease prevention and staying healthy. In between checkups, there are plenty of things you can do on your own. Experts have done a lot of research about which lifestyle changes and preventive measures are most likely to keep you healthy. Ask your health care provider for more information. Weight and diet Eat a healthy diet  Be sure to include plenty of vegetables, fruits, low-fat dairy products, and lean protein.  Do not eat a lot of foods high in solid fats, added sugars, or salt.  Get regular exercise. This is one of the most important things you can do for your health.  Most adults should exercise for at least 150 minutes each week. The exercise should increase your heart rate and make you sweat (moderate-intensity exercise).  Most adults should also do strengthening exercises at least twice a week. This is in addition to the moderate-intensity exercise. Maintain a healthy weight  Body mass index (BMI) is a measurement that can be used to identify possible weight problems. It estimates body fat based on height and weight. Your health care provider can help determine your BMI and help you achieve or maintain a healthy weight.  For females 76 years of age and older:  A BMI below 18.5 is considered underweight.  A BMI of 18.5 to 24.9 is normal.  A BMI of 25 to 29.9 is considered overweight.  A BMI of 30 and above is considered obese. Watch levels of cholesterol and blood lipids  You should start having your blood tested for lipids and cholesterol at 37 years of age, then have this test every 5 years.  You may need to have your cholesterol levels checked more often if:  Your lipid or  cholesterol levels are high.  You are older than 37 years of age.  You are at high risk for heart disease. Cancer screening Lung Cancer  Lung cancer screening is recommended for adults 64-42 years old who are at high risk for lung cancer because of a history of smoking.  A yearly low-dose CT scan of the lungs is recommended for people who:  Currently smoke.  Have quit within the past 15 years.  Have at least a 30-pack-year history of smoking. A pack year is smoking an average of one pack of cigarettes a day for 1 year.  Yearly screening should continue until it has been 15 years since you quit.  Yearly screening should stop if you develop a health problem that would prevent you from having lung cancer treatment. Breast Cancer  Practice breast self-awareness. This means understanding how your breasts normally appear and feel.  It also means doing regular breast self-exams. Let your health care provider know about any changes, no matter how small.  If you are in your 20s or 30s, you should have a clinical breast exam (CBE) by a health care provider every 1-3 years as part of a regular health exam.  If you are 34 or older, have a CBE every year. Also consider having a breast X-ray (mammogram) every year.  If you have a family history of breast cancer, talk to your health care provider about genetic screening.  If you are at high risk for breast cancer, talk  to your health care provider about having an MRI and a mammogram every year.  Breast cancer gene (BRCA) assessment is recommended for women who have family members with BRCA-related cancers. BRCA-related cancers include:  Breast.  Ovarian.  Tubal.  Peritoneal cancers.  Results of the assessment will determine the need for genetic counseling and BRCA1 and BRCA2 testing. Cervical Cancer  Your health care provider may recommend that you be screened regularly for cancer of the pelvic organs (ovaries, uterus, and vagina).  This screening involves a pelvic examination, including checking for microscopic changes to the surface of your cervix (Pap test). You may be encouraged to have this screening done every 3 years, beginning at age 24.  For women ages 66-65, health care providers may recommend pelvic exams and Pap testing every 3 years, or they may recommend the Pap and pelvic exam, combined with testing for human papilloma virus (HPV), every 5 years. Some types of HPV increase your risk of cervical cancer. Testing for HPV may also be done on women of any age with unclear Pap test results.  Other health care providers may not recommend any screening for nonpregnant women who are considered low risk for pelvic cancer and who do not have symptoms. Ask your health care provider if a screening pelvic exam is right for you.  If you have had past treatment for cervical cancer or a condition that could lead to cancer, you need Pap tests and screening for cancer for at least 20 years after your treatment. If Pap tests have been discontinued, your risk factors (such as having a new sexual partner) need to be reassessed to determine if screening should resume. Some women have medical problems that increase the chance of getting cervical cancer. In these cases, your health care provider may recommend more frequent screening and Pap tests. Colorectal Cancer  This type of cancer can be detected and often prevented.  Routine colorectal cancer screening usually begins at 37 years of age and continues through 37 years of age.  Your health care provider may recommend screening at an earlier age if you have risk factors for colon cancer.  Your health care provider may also recommend using home test kits to check for hidden blood in the stool.  A small camera at the end of a tube can be used to examine your colon directly (sigmoidoscopy or colonoscopy). This is done to check for the earliest forms of colorectal cancer.  Routine  screening usually begins at age 41.  Direct examination of the colon should be repeated every 5-10 years through 37 years of age. However, you may need to be screened more often if early forms of precancerous polyps or small growths are found. Skin Cancer  Check your skin from head to toe regularly.  Tell your health care provider about any new moles or changes in moles, especially if there is a change in a mole's shape or color.  Also tell your health care provider if you have a mole that is larger than the size of a pencil eraser.  Always use sunscreen. Apply sunscreen liberally and repeatedly throughout the day.  Protect yourself by wearing long sleeves, pants, a wide-brimmed hat, and sunglasses whenever you are outside. Heart disease, diabetes, and high blood pressure  High blood pressure causes heart disease and increases the risk of stroke. High blood pressure is more likely to develop in:  People who have blood pressure in the high end of the normal range (130-139/85-89 mm Hg).  People who are overweight or obese.  People who are African American.  If you are 59-24 years of age, have your blood pressure checked every 3-5 years. If you are 34 years of age or older, have your blood pressure checked every year. You should have your blood pressure measured twice-once when you are at a hospital or clinic, and once when you are not at a hospital or clinic. Record the average of the two measurements. To check your blood pressure when you are not at a hospital or clinic, you can use:  An automated blood pressure machine at a pharmacy.  A home blood pressure monitor.  If you are between 29 years and 60 years old, ask your health care provider if you should take aspirin to prevent strokes.  Have regular diabetes screenings. This involves taking a blood sample to check your fasting blood sugar level.  If you are at a normal weight and have a low risk for diabetes, have this test once  every three years after 37 years of age.  If you are overweight and have a high risk for diabetes, consider being tested at a younger age or more often. Preventing infection Hepatitis B  If you have a higher risk for hepatitis B, you should be screened for this virus. You are considered at high risk for hepatitis B if:  You were born in a country where hepatitis B is common. Ask your health care provider which countries are considered high risk.  Your parents were born in a high-risk country, and you have not been immunized against hepatitis B (hepatitis B vaccine).  You have HIV or AIDS.  You use needles to inject street drugs.  You live with someone who has hepatitis B.  You have had sex with someone who has hepatitis B.  You get hemodialysis treatment.  You take certain medicines for conditions, including cancer, organ transplantation, and autoimmune conditions. Hepatitis C  Blood testing is recommended for:  Everyone born from [birth year] through 1966.  Anyone with known risk factors for hepatitis C. Sexually transmitted infections (STIs)  You should be screened for sexually transmitted infections (STIs) including gonorrhea and chlamydia if:  You are sexually active and are younger than 37 years of age.  You are older than 37 years of age and your health care provider tells you that you are at risk for this type of infection.  Your sexual activity has changed since you were last screened and you are at an increased risk for chlamydia or gonorrhea. Ask your health care provider if you are at risk.  If you do not have HIV, but are at risk, it may be recommended that you take a prescription medicine daily to prevent HIV infection. This is called pre-exposure prophylaxis (PrEP). You are considered at risk if:  You are sexually active and do not regularly use condoms or know the HIV status of your partner(s).  You take drugs by injection.  You are sexually active with a partner  who has HIV. Talk with your health care provider about whether you are at high risk of being infected with HIV. If you choose to begin PrEP, you should first be tested for HIV. You should then be tested every 3 months for as long as you are taking PrEP. Pregnancy  If you are premenopausal and you may become pregnant, ask your health care provider about preconception counseling.  If you may become pregnant, take 400 to 800 micrograms (mcg) of folic acid  every day.  If you want to prevent pregnancy, talk to your health care provider about birth control (contraception). Osteoporosis and menopause  Osteoporosis is a disease in which the bones lose minerals and strength with aging. This can result in serious bone fractures. Your risk for osteoporosis can be identified using a bone density scan.  If you are 4 years of age or older, or if you are at risk for osteoporosis and fractures, ask your health care provider if you should be screened.  Ask your health care provider whether you should take a calcium or vitamin D supplement to lower your risk for osteoporosis.  Menopause may have certain physical symptoms and risks.  Hormone replacement therapy may reduce some of these symptoms and risks. Talk to your health care provider about whether hormone replacement therapy is right for you. Follow these instructions at home:  Schedule regular health, dental, and eye exams.  Stay current with your immunizations.  Do not use any tobacco products including cigarettes, chewing tobacco, or electronic cigarettes.  If you are pregnant, do not drink alcohol.  If you are breastfeeding, limit how much and how often you drink alcohol.  Limit alcohol intake to no more than 1 drink per day for nonpregnant women. One drink equals 12 ounces of beer, 5 ounces of wine, or 1 ounces of hard liquor.  Do not use street drugs.  Do not share needles.  Ask your health care provider for help if you need support  or information about quitting drugs.  Tell your health care provider if you often feel depressed.  Tell your health care provider if you have ever been abused or do not feel safe at home. This information is not intended to replace advice given to you by your health care provider. Make sure you discuss any questions you have with your health care provider. Document Released: 06/09/2011 Document Revised: 05/01/2016 Document Reviewed: 08/28/2015 Elsevier Interactive Patient Education  2017 [employer].

## 2017-03-30 NOTE — Progress Notes (Signed)
BP 125/82 (BP Location: Left Arm, Patient Position: Sitting, Cuff Size: Large)   Pulse 64   Temp 98.4 F (36.9 C)   Wt 184 lb 6.4 oz (83.6 kg)   LMP 03/15/2017 (Exact Date)   SpO2 100%   BMI 32.56 kg/m    Subjective:    Patient ID: Marilyn Francis, female    DOB: 1981/03/21, 36 y.o.   MRN: [id number]  HPI: Marilyn Francis is a 36 y.o. female presenting on 03/30/2017 for comprehensive medical examination. Current medical complaints include:none  She currently lives with: wife Menopausal Symptoms: no  Depression Screen done today and results listed below:  Depression screen PHQ 2/9 03/27/2017  Decreased Interest 0  Down, Depressed, Hopeless 0  PHQ - 2 Score 0    Past Medical History:  Past Medical History:  Diagnosis Date  . Allergic rhinitis   . Bipolar 2 disorder [facility]     Surgical History:  Past Surgical History:  Procedure Laterality Date  . birth mark removal    . DENTAL SURGERY      Medications:  Current Outpatient Prescriptions on File Prior to Visit  Medication Sig  . Calcium Carbonate-Vit D-Min (CALCIUM 1200 PO) Take by mouth.  . cetirizine (ZYRTEC) 10 MG tablet Take 10 mg by mouth daily.  . cholecalciferol (VITAMIN D) 1000 units tablet Take 1,000 Units by mouth daily.  Marland Kitchen ibuprofen (ADVIL,MOTRIN) 600 MG tablet Take 600 mg by mouth every 6 (six) hours as needed.  . lamoTRIgine (LAMICTAL) 100 MG tablet Take 1 tablet (100 mg total) by mouth daily.  . Magnesium 200 MG TABS Take 1 tablet by mouth daily.  . Multiple Vitamin (MULTIVITAMIN) tablet Take 1 tablet by mouth daily.   No current facility-administered medications on file prior to visit.     Allergies:  No Known Allergies  Social History:  Social History   Social History  . Marital status: Married    Spouse name: N/A  . Number of children: N/A  . Years of education: N/A   Occupational History  . Not on file.   Social History Main Topics  . Smoking status: Never Smoker  . Smokeless  tobacco: Never Used  . Alcohol use Yes     Comment: occ  . Drug use: No  . Sexual activity: Yes    Birth control/ protection: None   Other Topics Concern  . Not on file   Social History Narrative  . No narrative on file   History  Smoking Status  . Never Smoker  Smokeless Tobacco  . Never Used   History  Alcohol Use  . Yes    Comment: occ    Family History:  Family History  Problem Relation Age of Onset  . Hypertension Mother   . Hypertension Father   . Breast cancer Maternal Grandmother   . Heart disease Paternal Grandmother   . Cancer Paternal Grandfather     Colon    Past medical history, surgical history, medications, allergies, family history and social history reviewed with patient today and changes made to appropriate areas of the chart.   Review of Systems  Constitutional: Negative.   HENT: Positive for congestion. Negative for ear discharge, ear pain, hearing loss, nosebleeds, sinus pain, sore throat and tinnitus.   Eyes: Negative.   Respiratory: Positive for shortness of breath. Negative for cough, hemoptysis, sputum production, wheezing and stridor.   Cardiovascular: Negative.   Gastrointestinal: Positive for constipation. Negative for abdominal pain, blood in stool, diarrhea, heartburn, melena,  nausea and vomiting.  Genitourinary: Negative.   Musculoskeletal: Negative.   Skin: Negative.   Neurological: Negative.   Endo/Heme/Allergies: Positive for environmental allergies. Negative for polydipsia. Bruises/bleeds easily.  Psychiatric/Behavioral: Negative.     All other ROS negative except what is listed above and in the HPI.      Objective:    BP 125/82 (BP Location: Left Arm, Patient Position: Sitting, Cuff Size: Large)   Pulse 64   Temp 98.4 F (36.9 C)   Wt 184 lb 6.4 oz (83.6 kg)   LMP 03/15/2017 (Exact Date)   SpO2 100%   BMI 32.56 kg/m   Wt Readings from Last 3 Encounters:  03/30/17 184 lb 6.4 oz (83.6 kg)  03/27/17 182 lb 6.4 oz  (82.7 kg)  10/10/16 183 lb (83 kg)    Physical Exam  Constitutional: She is oriented to person, place, and time. She appears well-developed and well-nourished. No distress.  HENT:  Head: Normocephalic and atraumatic.  Right Ear: Hearing, tympanic membrane, external ear and ear canal normal.  Left Ear: Hearing, tympanic membrane, external ear and ear canal normal.  Nose: Nose normal.  Mouth/Throat: Uvula is midline, oropharynx is clear and moist and mucous membranes are normal. No oropharyngeal exudate.  Eyes: Conjunctivae, EOM and lids are normal. Pupils are equal, round, and reactive to light. Right eye exhibits no discharge. Left eye exhibits no discharge. No scleral icterus.  Neck: Normal range of motion. Neck supple. No JVD present. No tracheal deviation present. No thyromegaly present.  Cardiovascular: Normal rate, regular rhythm, normal heart sounds and intact distal pulses.  Exam reveals no gallop and no friction rub.   No murmur heard. Pulmonary/Chest: Effort normal and breath sounds normal. No stridor. No respiratory distress. She has no wheezes. She has no rales. She exhibits no tenderness. Right breast exhibits no inverted nipple, no mass, no nipple discharge, no skin change and no tenderness. Left breast exhibits no inverted nipple, no mass, no nipple discharge, no skin change and no tenderness. Breasts are symmetrical.  Abdominal: Soft. Bowel sounds are normal. She exhibits no distension and no mass. There is no tenderness. There is no rebound and no guarding.  Genitourinary: Vagina normal and uterus normal. No vaginal discharge found.  Musculoskeletal: Normal range of motion. She exhibits no edema, tenderness or deformity.  Lymphadenopathy:    She has no cervical adenopathy.  Neurological: She is alert and oriented to person, place, and time. She has normal reflexes. She displays normal reflexes. No cranial nerve deficit. She exhibits normal muscle tone. Coordination normal.    Skin: Skin is warm, dry and intact. No rash noted. She is not diaphoretic. No erythema. No pallor.  Psychiatric: She has a normal mood and affect. Her speech is normal and behavior is normal. Judgment and thought content normal. Cognition and memory are normal.  Nursing note and vitals reviewed.   Results for orders placed or performed in visit on 03/27/17  Microscopic Examination  Result Value Ref Range   WBC, UA None seen 0 - 5 /hpf   RBC, UA 0-2 0 - 2 /hpf   Epithelial Cells (non renal) 0-10 0 - 10 /hpf   Bacteria, UA Moderate (A) None seen/Few  CBC with Differential/Platelet  Result Value Ref Range   WBC 6.2 3.4 - 10.8 x10E3/uL   RBC 4.17 3.77 - 5.28 x10E6/uL   Hemoglobin 13.2 11.1 - 15.9 g/dL   Hematocrit 39.0 34.0 - 46.6 %   MCV 94 79 - 97 fL  MCH 31.7 26.6 - 33.0 pg   MCHC 33.8 31.5 - 35.7 g/dL   RDW 13.3 12.3 - 15.4 %   Platelets 209 150 - 379 x10E3/uL   Neutrophils 66 Not Estab. %   Lymphs 27 Not Estab. %   Monocytes 5 Not Estab. %   Eos 2 Not Estab. %   Basos 0 Not Estab. %   Neutrophils Absolute 4.1 1.4 - 7.0 x10E3/uL   Lymphocytes Absolute 1.7 0.7 - 3.1 x10E3/uL   Monocytes Absolute 0.3 0.1 - 0.9 x10E3/uL   EOS (ABSOLUTE) 0.2 0.0 - 0.4 x10E3/uL   Basophils Absolute 0.0 0.0 - 0.2 x10E3/uL   Immature Granulocytes 0 Not Estab. %   Immature Grans (Abs) 0.0 0.0 - 0.1 x10E3/uL  Comprehensive metabolic panel  Result Value Ref Range   Glucose 92 65 - 99 mg/dL   BUN 18 6 - 20 mg/dL   Creatinine, Ser 1.06 (H) 0.57 - 1.00 mg/dL   GFR calc non Af Amer 68 >59 mL/min/1.73   GFR calc Af Amer 78 >59 mL/min/1.73   BUN/Creatinine Ratio 17 9 - 23   Sodium 140 134 - 144 mmol/L   Potassium 4.7 3.5 - 5.2 mmol/L   Chloride 103 96 - 106 mmol/L   CO2 25 18 - 29 mmol/L   Calcium 9.4 8.7 - 10.2 mg/dL   Total Protein 6.2 6.0 - 8.5 g/dL   Albumin 4.3 3.5 - 5.5 g/dL   Globulin, Total 1.9 1.5 - 4.5 g/dL   Albumin/Globulin Ratio 2.3 (H) 1.2 - 2.2   Bilirubin Total 0.2 0.0 - 1.2  mg/dL   Alkaline Phosphatase 47 39 - 117 IU/L   AST 19 0 - 40 IU/L   ALT 8 0 - 32 IU/L  Lipid Panel w/o Chol/HDL Ratio  Result Value Ref Range   Cholesterol, Total 173 100 - 199 mg/dL   Triglycerides 63 0 - 149 mg/dL   HDL 79 >39 mg/dL   VLDL Cholesterol Cal 13 5 - 40 mg/dL   LDL Calculated 81 0 - 99 mg/dL  TSH  Result Value Ref Range   TSH 2.790 0.450 - 4.500 uIU/mL  UA/M w/rflx Culture, Routine  Result Value Ref Range   Specific Gravity, UA 1.020 1.005 - 1.030   pH, UA 7.0 5.0 - 7.5   Color, UA Yellow Yellow   Appearance Ur Cloudy (A) Clear   Leukocytes, UA Negative Negative   Protein, UA Negative Negative/Trace   Glucose, UA Negative Negative   Ketones, UA Negative Negative   RBC, UA Negative Negative   Bilirubin, UA Negative Negative   Urobilinogen, Ur 0.2 0.2 - 1.0 mg/dL   Nitrite, UA Negative Negative   Microscopic Examination See below:    Urinalysis Reflex Comment   Lamotrigine level  Result Value Ref Range   Lamotrigine Lvl 1.5 (L) 2.0 - 20.0 ug/mL  Urine Culture, Routine  Result Value Ref Range   Urine Culture, Routine Final report    Urine Culture result 1 Comment       Assessment & Plan:   Problem List Items Addressed This Visit    None    Visit Diagnoses    Routine general medical examination at a health care facility    -  Primary   Up to date on vaccines. Screening labs checked last visit. Pap done today. Continue diet and exercise. Call with any concerns.    Screening for cervical cancer       Pap done today. Await results.  Relevant Orders   IGP, Aptima HPV, rfx 16/18,45       Follow up plan: Return in about 1 year (around 03/30/2018) for Physical .   LABORATORY TESTING:  - Pap smear: pap done  IMMUNIZATIONS:   - Tdap: Tetanus vaccination status reviewed: last tetanus booster within 10 years. - Influenza: Postponed to flu season - Pneumovax: Not applicable   PATIENT COUNSELING:   Advised to take 1 mg of folate supplement per day if  capable of pregnancy.   Sexuality: Discussed sexually transmitted diseases, partner selection, use of condoms, avoidance of unintended pregnancy  and contraceptive alternatives.   Advised to avoid cigarette smoking.  I discussed with the patient that most people either abstain from alcohol or drink within safe limits (<=14/week and <=4 drinks/occasion for males, <=7/weeks and <= 3 drinks/occasion for females) and that the risk for alcohol disorders and other health effects rises proportionally with the number of drinks per week and how often a drinker exceeds daily limits.  Discussed cessation/primary prevention of drug use and availability of treatment for abuse.   Diet: Encouraged to adjust caloric intake to maintain  or achieve ideal body weight, to reduce intake of dietary saturated fat and total fat, to limit sodium intake by avoiding high sodium foods and not adding table salt, and to maintain adequate dietary potassium and calcium preferably from fresh fruits, vegetables, and low-fat dairy products.    stressed the importance of regular exercise  Injury prevention: Discussed safety belts, safety helmets, smoke detector, smoking near bedding or upholstery.   Dental health: Discussed importance of regular tooth brushing, flossing, and dental visits.    NEXT PREVENTATIVE PHYSICAL DUE IN 1 YEAR. Return in about 1 year (around 03/30/2018) for Physical .

## 2017-04-02 LAB — IGP, APTIMA HPV, RFX 16/18,45
HPV Aptima: NEGATIVE
PAP SMEAR COMMENT: 0

## 2018-02-05 ENCOUNTER — Encounter: Payer: Self-pay | Admitting: Unknown Physician Specialty

## 2018-02-05 ENCOUNTER — Ambulatory Visit (INDEPENDENT_AMBULATORY_CARE_PROVIDER_SITE_OTHER): Payer: BLUE CROSS/BLUE SHIELD | Admitting: Unknown Physician Specialty

## 2018-02-05 VITALS — BP 117/77 | HR 65 | Temp 98.2°F | Wt 174.8 lb

## 2018-02-05 DIAGNOSIS — J320 Chronic maxillary sinusitis: Secondary | ICD-10-CM | POA: Diagnosis not present

## 2018-02-05 MED ORDER — ALBUTEROL SULFATE HFA 108 (90 BASE) MCG/ACT IN AERS: 2.0000 | INHALATION_SPRAY | Freq: Four times a day (QID) | RESPIRATORY_TRACT | 2 refills | Status: DC | PRN

## 2018-02-05 MED ORDER — AMOXICILLIN-POT CLAVULANATE 875-125 MG PO TABS
1.0000 | ORAL_TABLET | Freq: Two times a day (BID) | ORAL | 0 refills | Status: DC
Start: 2018-02-05 — End: 2020-06-07

## 2018-02-05 MED ORDER — PREDNISONE 20 MG PO TABS: 20.0000 mg | ORAL_TABLET | Freq: Every day | ORAL | 0 refills | Status: DC

## 2018-02-05 NOTE — Progress Notes (Signed)
BP 117/77   Pulse 65   Temp 98.2 F (36.8 C) (Oral)   Wt 174 lb 12.8 oz (79.3 kg)   SpO2 98%   BMI 30.87 kg/m    Subjective:    Patient ID: Marilyn Francis, female    DOB: 1981-06-10, 37 y.o.   MRN: [id number]  HPI: Marilyn Francis is a 37 y.o. female  Chief Complaint  Patient presents with  . URI    pt states she has had a cough, congestion, sore throat, sinus pressure, runny nose, and right ear pain for a while. States she has had ongoing sinus trouble since last year and has a cold on top of it now   Sinusitis  Chronicity: Having ongoing sinus problems with added on URIs. Episode onset: Off and on for months. The problem has been gradually worsening since onset. There has been no fever. She is experiencing no pain. Associated symptoms include congestion, coughing, sinus pressure, a sore throat and swollen glands. Pertinent negatives include no chills, diaphoresis, ear pain, headaches, hoarse voice, neck pain, shortness of breath or sneezing. (Right jaw pain) Treatments tried: netti pots and multiple OTC. The treatment provided no relief.     Relevant past medical, surgical, family and social history reviewed and updated as indicated. Interim medical history since our last visit reviewed. Allergies and medications reviewed and updated.  Review of Systems  Constitutional: Negative for chills and diaphoresis.  HENT: Positive for congestion, sinus pressure and sore throat. Negative for ear pain, hoarse voice and sneezing.   Respiratory: Positive for cough. Negative for shortness of breath.   Musculoskeletal: Negative for neck pain.  Neurological: Negative for headaches.    Per HPI unless specifically indicated above     Objective:    BP 117/77   Pulse 65   Temp 98.2 F (36.8 C) (Oral)   Wt 174 lb 12.8 oz (79.3 kg)   SpO2 98%   BMI 30.87 kg/m   Wt Readings from Last 3 Encounters:  02/05/18 174 lb 12.8 oz (79.3 kg)  03/30/17 184 lb 6.4 oz (83.6 kg)  03/27/17 182  lb 6.4 oz (82.7 kg)    Physical Exam  Constitutional: She is oriented to person, place, and time. She appears well-developed and well-nourished. No distress.  HENT:  Head: Normocephalic and atraumatic.  Right Ear: Tympanic membrane and ear canal normal.  Left Ear: Tympanic membrane and ear canal normal.  Nose: No rhinorrhea. Right sinus exhibits maxillary sinus tenderness. Right sinus exhibits no frontal sinus tenderness. Left sinus exhibits maxillary sinus tenderness. Left sinus exhibits no frontal sinus tenderness.  Eyes: Conjunctivae and lids are normal. Right eye exhibits no discharge. Left eye exhibits no discharge. No scleral icterus.  Cardiovascular: Normal rate and regular rhythm.  Pulmonary/Chest: Effort normal and breath sounds normal. No respiratory distress.  Abdominal: Normal appearance. There is no splenomegaly or hepatomegaly.  Musculoskeletal: Normal range of motion.  Neurological: She is alert and oriented to person, place, and time.  Skin: Skin is intact. No rash noted. No pallor.  Psychiatric: She has a normal mood and affect. Her behavior is normal. Judgment and thought content normal.    Results for orders placed or performed in visit on 03/30/17  IGP, Aptima HPV, rfx 16/18,45  Result Value Ref Range   DIAGNOSIS: Comment    Specimen adequacy: Comment    Clinician Provided ICD10 Comment    Performed by: Comment    QC reviewed by: Comment    PAP Smear Comment .  Note: Comment    Test Methodology Comment    HPV Aptima Negative Negative      Assessment & Plan:   Problem List Items Addressed This Visit    None    Visit Diagnoses    Chronic maxillary sinusitis    -  Primary   Chronic sinusitis with superimposed acute viral infections.  Rx for Augmentin 850 mg BID for 10 days.  Contue with a netti pot and OTC meds.  Rx for Prednisone    Relevant Medications   predniSONE (DELTASONE) 20 MG tablet   amoxicillin-clavulanate (AUGMENTIN) 875-125 MG tablet        Follow up plan: Return if symptoms worsen or fail to improve.

## 2018-02-15 ENCOUNTER — Telehealth: Payer: Self-pay | Admitting: Unknown Physician Specialty

## 2018-02-15 NOTE — Telephone Encounter (Signed)
Called patient and informed her of needed appt. Patient stated that she is unable to come in for an appt due to being unable to afford it. Patient stated that she did not she would have to make another appointment in regards to symptoms and will wait to make an appt.   Informed pt. I would let the provider know.

## 2018-02-15 NOTE — Telephone Encounter (Signed)
Please have her come back in for a recheck

## 2018-02-15 NOTE — Telephone Encounter (Signed)
Please call patient and have her come back in for an appointment for a recheck if she is not better per Dr. Wynetta Emery.

## 2018-02-15 NOTE — Telephone Encounter (Signed)
Routing to provider. Patient states she is not feeling any better from previous visit.

## 2020-06-07 ENCOUNTER — Other Ambulatory Visit: Payer: Self-pay

## 2020-06-07 ENCOUNTER — Ambulatory Visit
Admission: RE | Admit: 2020-06-07 | Discharge: 2020-06-07 | Disposition: A | Discharge status: Home / Self Care | Payer: Managed Care, Other (non HMO) | Source: Ambulatory Visit | Attending: Family Medicine | Admitting: Family Medicine

## 2020-06-07 ENCOUNTER — Ambulatory Visit (INDEPENDENT_AMBULATORY_CARE_PROVIDER_SITE_OTHER): Payer: Managed Care, Other (non HMO) | Admitting: Family Medicine

## 2020-06-07 ENCOUNTER — Encounter: Payer: Self-pay | Admitting: Family Medicine

## 2020-06-07 ENCOUNTER — Ambulatory Visit
Admission: RE | Admit: 2020-06-07 | Discharge: 2020-06-07 | Disposition: A | Discharge status: Home / Self Care | Payer: Managed Care, Other (non HMO) | Attending: Family Medicine | Admitting: Family Medicine

## 2020-06-07 VITALS — BP 127/84 | HR 64 | Temp 98.4°F | Ht 62.99 in | Wt 168.2 lb

## 2020-06-07 DIAGNOSIS — G8929 Other chronic pain: Secondary | ICD-10-CM | POA: Insufficient documentation

## 2020-06-07 DIAGNOSIS — M542 Cervicalgia: Secondary | ICD-10-CM | POA: Insufficient documentation

## 2020-06-07 DIAGNOSIS — Z8639 Personal history of other endocrine, nutritional and metabolic disease: Secondary | ICD-10-CM | POA: Diagnosis not present

## 2020-06-07 DIAGNOSIS — L309 Dermatitis, unspecified: Secondary | ICD-10-CM | POA: Diagnosis not present

## 2020-06-07 DIAGNOSIS — Z0000 Encounter for general adult medical examination without abnormal findings: Secondary | ICD-10-CM | POA: Diagnosis not present

## 2020-06-07 DIAGNOSIS — Z113 Encounter for screening for infections with a predominantly sexual mode of transmission: Secondary | ICD-10-CM

## 2020-06-07 DIAGNOSIS — Z23 Encounter for immunization: Secondary | ICD-10-CM | POA: Diagnosis not present

## 2020-06-07 DIAGNOSIS — Z1231 Encounter for screening mammogram for malignant neoplasm of breast: Secondary | ICD-10-CM

## 2020-06-07 DIAGNOSIS — Z114 Encounter for screening for human immunodeficiency virus [HIV]: Secondary | ICD-10-CM

## 2020-06-07 DIAGNOSIS — K581 Irritable bowel syndrome with constipation: Secondary | ICD-10-CM | POA: Diagnosis not present

## 2020-06-07 DIAGNOSIS — Z1159 Encounter for screening for other viral diseases: Secondary | ICD-10-CM

## 2020-06-07 LAB — URINALYSIS, ROUTINE W REFLEX MICROSCOPIC
Bilirubin, UA: NEGATIVE
Glucose, UA: NEGATIVE
Ketones, UA: NEGATIVE
Leukocytes,UA: NEGATIVE
Nitrite, UA: NEGATIVE
Protein,UA: NEGATIVE
RBC, UA: NEGATIVE
Specific Gravity, UA: 1.015 (ref 1.005–1.030)
Urobilinogen, Ur: 0.2 mg/dL (ref 0.2–1.0)
pH, UA: 7 (ref 5.0–7.5)

## 2020-06-07 IMAGING — DX DG CERVICAL SPINE COMPLETE 4+V
7 series · 7 of 7 positions shown · non-contrast
Comparison: None.

CLINICAL DATA: Chronic cervicalgia with upper extremity radicular
type symptoms

EXAM:
CERVICAL SPINE - COMPLETE 4+ VIEW

[c-spine lat]
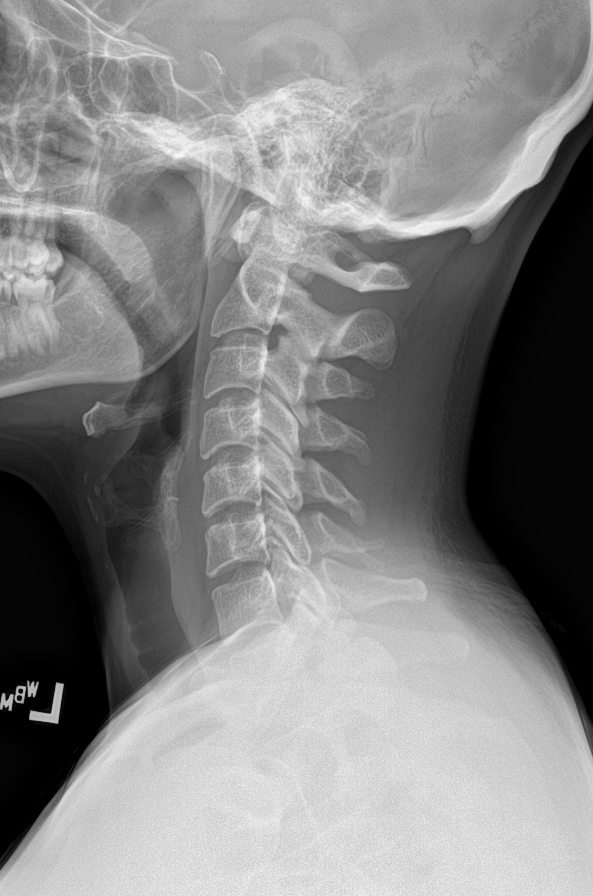

[c-spine obl (1 of 2)]
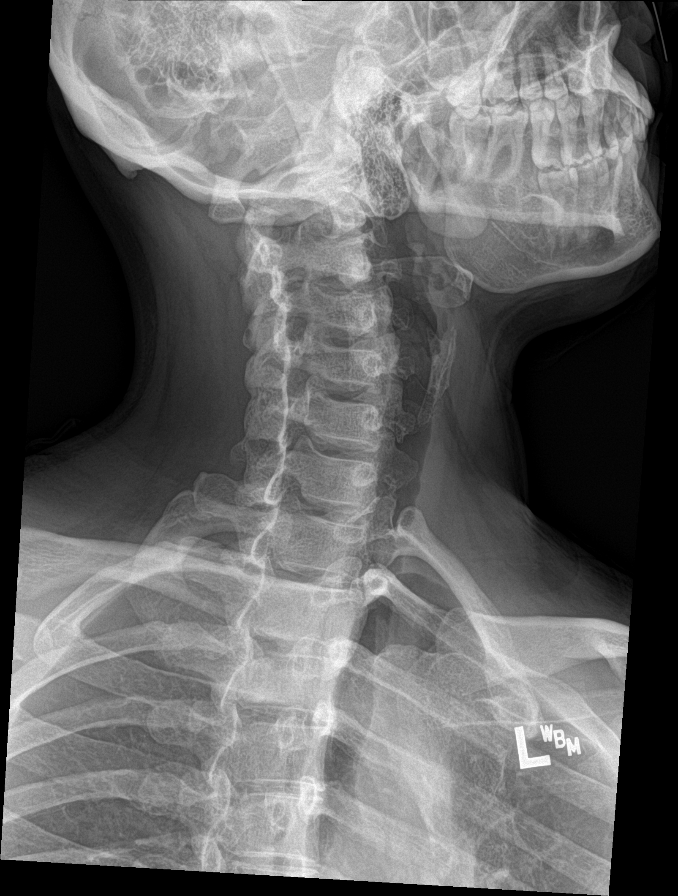

[c-spine obl (2 of 2)]
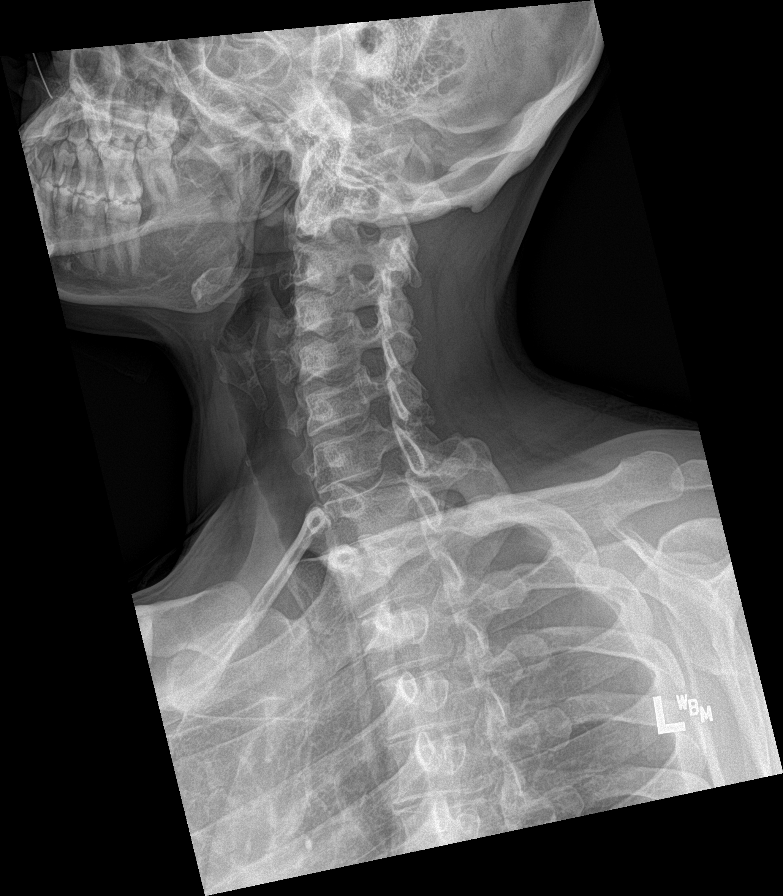

[c-spine ap]
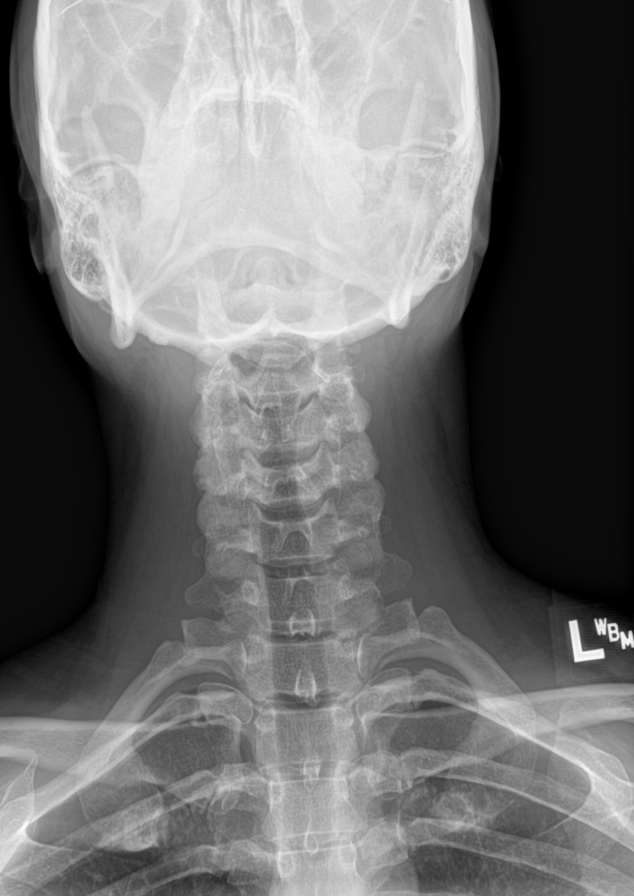

[c-spine open mouth (1 of 2)]
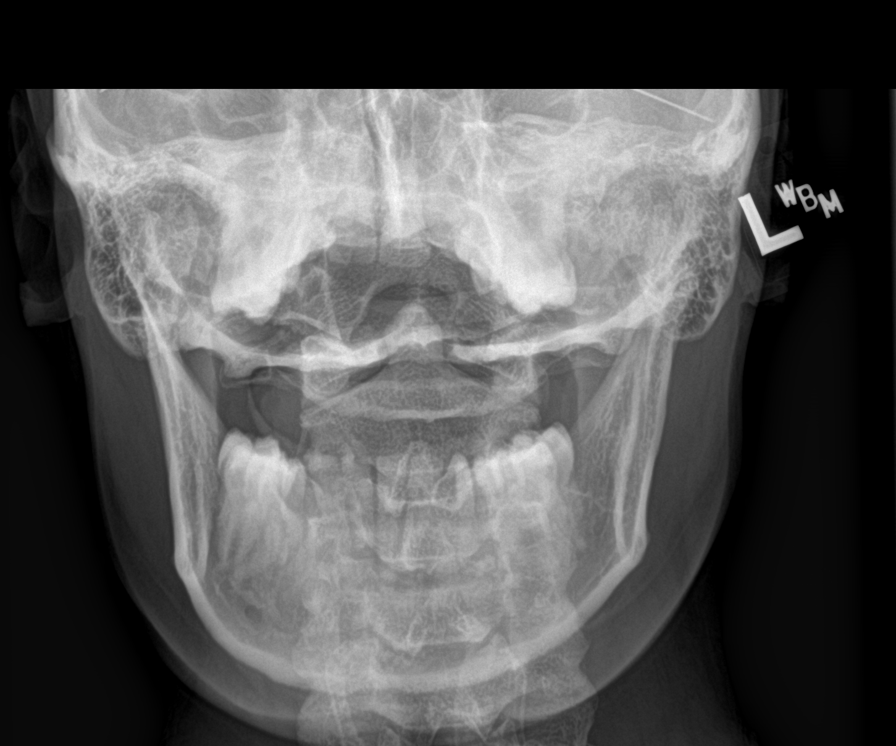

[c-spine swimmers]
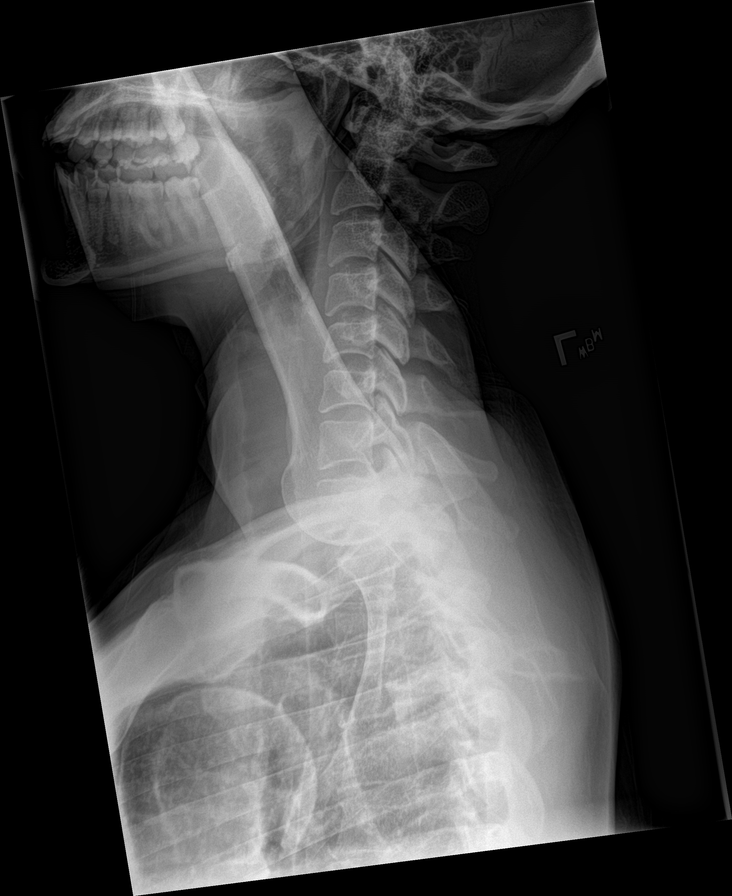

[c-spine open mouth (2 of 2)]
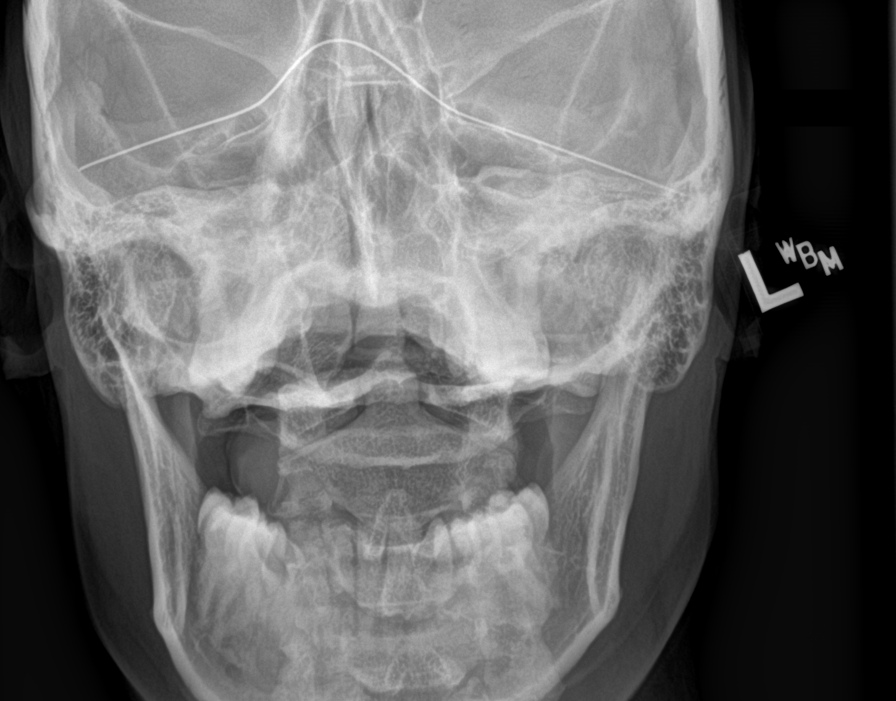

[7 of 7 positions shown; findings below may reference images not displayed]

FINDINGS: Frontal, lateral, open-mouth odontoid, and bilateral oblique views
were obtained. There is no fracture or spondylolisthesis.
Prevertebral soft tissues and predental space regions are normal.
The disc spaces appear normal. There is no appreciable exit
foraminal narrowing on the oblique views. Lung apices are clear.
IMPRESSION: No fracture or spondylolisthesis.  No evident arthropathy.

## 2020-06-07 MED ORDER — TRIAMCINOLONE ACETONIDE 0.5 % EX OINT
1.0000 | TOPICAL_OINTMENT | Freq: Two times a day (BID) | CUTANEOUS | 1 refills | Status: DC
Start: 2020-06-07 — End: 2022-03-13

## 2020-06-07 MED ORDER — LUBIPROSTONE 8 MCG PO CAPS: 8.0000 ug | ORAL_CAPSULE | Freq: Two times a day (BID) | ORAL | 3 refills | Status: DC

## 2020-06-07 NOTE — Patient Instructions (Addendum)
Ask therapist about neuropsych testing. If she doesn't do it- call me and I'll refer you.  Call to schedule your mammogram: [facility] at [facility]  Address: [address], [place], [place] [address]  Phone: [phone]    Health Maintenance, Female Adopting a healthy lifestyle and getting preventive care are important in promoting health and wellness. Ask your health care provider about:  The right schedule for you to have regular tests and exams.  Things you can do on your own to prevent diseases and keep yourself healthy. What should I know about diet, weight, and exercise? Eat a healthy diet   Eat a diet that includes plenty of vegetables, fruits, low-fat dairy products, and lean protein.  Do not eat a lot of foods that are high in solid fats, added sugars, or sodium. Maintain a healthy weight Body mass index (BMI) is used to identify weight problems. It estimates body fat based on height and weight. Your health care provider can help determine your BMI and help you achieve or maintain a healthy weight. Get regular exercise Get regular exercise. This is one of the most important things you can do for your health. Most adults should:  Exercise for at least 150 minutes each week. The exercise should increase your heart rate and make you sweat (moderate-intensity exercise).  Do strengthening exercises at least twice a week. This is in addition to the moderate-intensity exercise.  Spend less time sitting. Even light physical activity can be beneficial. Watch cholesterol and blood lipids Have your blood tested for lipids and cholesterol at 40 years of age, then have this test every 5 years. Have your cholesterol levels checked more often if:  Your lipid or cholesterol levels are high.  You are older than 40 years of age.  You are at high risk for heart disease. What should I know about cancer screening? Depending on your health history and family  history, you may need to have cancer screening at various ages. This may include screening for:  Breast cancer.  Cervical cancer.  Colorectal cancer.  Skin cancer.  Lung cancer. What should I know about heart disease, diabetes, and high blood pressure? Blood pressure and heart disease  High blood pressure causes heart disease and increases the risk of stroke. This is more likely to develop in people who have high blood pressure readings, are of African descent, or are overweight.  Have your blood pressure checked: ? Every 3-5 years if you are 78-55 years of age. ? Every year if you are 66 years old or older. Diabetes Have regular diabetes screenings. This checks your fasting blood sugar level. Have the screening done:  Once every three years after age 68 if you are at a normal weight and have a low risk for diabetes.  More often and at a younger age if you are overweight or have a high risk for diabetes. What should I know about preventing infection? Hepatitis B If you have a higher risk for hepatitis B, you should be screened for this virus. Talk with your health care provider to find out if you are at risk for hepatitis B infection. Hepatitis C Testing is recommended for:  Everyone born from [birth year] through 1966.  Anyone with known risk factors for hepatitis C. Sexually transmitted infections (STIs)  Get screened for STIs, including gonorrhea and chlamydia, if: ? You are sexually active and are younger than 40 years of age. ? You are older than 40 years of  age and your health care provider tells you that you are at risk for this type of infection. ? Your sexual activity has changed since you were last screened, and you are at increased risk for chlamydia or gonorrhea. Ask your health care provider if you are at risk.  Ask your health care provider about whether you are at high risk for HIV. Your health care provider may recommend a prescription medicine to help prevent HIV  infection. If you choose to take medicine to prevent HIV, you should first get tested for HIV. You should then be tested every 3 months for as long as you are taking the medicine. Pregnancy  If you are about to stop having your period (premenopausal) and you may become pregnant, seek counseling before you get pregnant.  Take 400 to 800 micrograms (mcg) of folic acid every day if you become pregnant.  Ask for birth control (contraception) if you want to prevent pregnancy. Osteoporosis and menopause Osteoporosis is a disease in which the bones lose minerals and strength with aging. This can result in bone fractures. If you are 18 years old or older, or if you are at risk for osteoporosis and fractures, ask your health care provider if you should:  Be screened for bone loss.  Take a calcium or vitamin D supplement to lower your risk of fractures.  Be given hormone replacement therapy (HRT) to treat symptoms of menopause. Follow these instructions at home: Lifestyle  Do not use any products that contain nicotine or tobacco, such as cigarettes, e-cigarettes, and chewing tobacco. If you need help quitting, ask your health care provider.  Do not use street drugs.  Do not share needles.  Ask your health care provider for help if you need support or information about quitting drugs. Alcohol use  Do not drink alcohol if: ? Your health care provider tells you not to drink. ? You are pregnant, may be pregnant, or are planning to become pregnant.  If you drink alcohol: ? Limit how much you use to 0-1 drink a day. ? Limit intake if you are breastfeeding.  Be aware of how much alcohol is in your drink. In the [place] one drink equals one 12 oz bottle of beer (355 mL), one 5 oz glass of wine (148 mL), or one 1 oz glass of hard liquor (44 mL). General instructions  Schedule regular health, dental, and eye exams.  Stay current with your vaccines.  Tell your health care provider if: ? You  often feel depressed. ? You have ever been abused or do not feel safe at home. Summary  Adopting a healthy lifestyle and getting preventive care are important in promoting health and wellness.  Follow your health care provider's instructions about healthy diet, exercising, and getting tested or screened for diseases.  Follow your health care provider's instructions on monitoring your cholesterol and blood pressure. This information is not intended to replace advice given to you by your health care provider. Make sure you discuss any questions you have with your health care provider. Document Revised: 11/17/2018 Document Reviewed: 11/17/2018 Elsevier Patient Education  2020 [employer].

## 2020-06-07 NOTE — Progress Notes (Signed)
BP 127/84 (BP Location: Left Arm, Patient Position: Sitting, Cuff Size: Normal)   Pulse 64   Temp 98.4 F (36.9 C) (Oral)   Ht 5' 2.99" (1.6 m)   Wt 168 lb 3.2 oz (76.3 kg)   LMP 05/22/2020 (Within Weeks)   SpO2 99%   BMI 29.80 kg/m    Subjective:    Patient ID: Marilyn Francis, female    DOB: January 16, 1981, 39 y.o.   MRN: [id number]  HPI: Marilyn Francis is a 39 y.o. female presenting on 06/07/2020 for comprehensive medical examination. Current medical complaints include:  ADHD FOLLOW UP- started seeing a therapist and she was concerned about ADD. Never had any issues as a kid ADHD status: uncontrolled Satisfied with current therapy: no Work/school performance:  average Difficulty sustaining attention/completing tasks: yes Distracted by extraneous stimuli: yes Does not listen when spoken to: yes  Fidgets with hands or feet: no Unable to stay in seat: yes Blurts out/interrupts others: no  ABDOMINAL ISSUES Duration: chronic Nature: bloating and discomfort Location: diffuse  Severity: mild  Radiation: no Frequency: with eating Treatments attempted: fiber, laxitives, stretching, diet Constipation: yes Diarrhea: no Mucous in the stool: no Heartburn: no Bloating:yes Flatulence: yes Nausea: no Vomiting: no Melena or hematochezia: no Rash: no Jaundice: no Fever: no Weight loss: no  NECK PAIN  Status: exacerbated Treatments attempted: chiropractry, massage, rest, ice, heat, APAP, ibuprofen and aleve  Compliant with recommended treatment: yes Relief with NSAIDs?:  mild Location:Right Duration:chronic Severity: moderate Quality: aching and sore and tight Frequency: constant Radiation: headache Aggravating factors: lifting and movement Alleviating factors: nothing Weakness:  no Paresthesias / decreased sensation:  no  Fevers:  no  Eczema has been doing OK, but needs a refill of her triamcinalone.   Menopausal Symptoms: no  Depression Screen done today and  results listed below:  Depression screen [facility] 2/9 06/07/2020 03/27/2017  Decreased Interest 1 0  Down, Depressed, Hopeless 1 0  PHQ - 2 Score 2 0  Altered sleeping 1 -  Tired, decreased energy 3 -  Change in appetite 1 -  Feeling bad or failure about yourself  1 -  Trouble concentrating 2 -  Moving slowly or fidgety/restless 1 -  Suicidal thoughts 0 -  PHQ-9 Score 11 -  Difficult doing work/chores Somewhat difficult -    Past Medical History:  Past Medical History:  Diagnosis Date  . Allergic rhinitis   . Bipolar 2 disorder [facility])     Surgical History:  Past Surgical History:  Procedure Laterality Date  . birth mark removal    . DENTAL SURGERY      Medications:  Current Outpatient Medications on File Prior to Visit  Medication Sig  . Calcium Carbonate-Vit D-Min (CALCIUM 1200 PO) Take by mouth.  . cetirizine (ZYRTEC) 10 MG tablet Take 10 mg by mouth daily.  . cholecalciferol (VITAMIN D) 1000 units tablet Take 1,000 Units by mouth daily.  Marland Kitchen ibuprofen (ADVIL,MOTRIN) 600 MG tablet Take 600 mg by mouth every 6 (six) hours as needed.  . Magnesium 200 MG TABS Take 1 tablet by mouth daily.  . Multiple Vitamin (MULTIVITAMIN) tablet Take 1 tablet by mouth daily.   No current facility-administered medications on file prior to visit.    Allergies:  No Known Allergies  Social History:  Social History   Socioeconomic History  . Marital status: Married    Spouse name: Not on file  . Number of children: Not on file  . Years of education: Not on file  .  Highest education level: Not on file  Occupational History  . Not on file  Tobacco Use  . Smoking status: Never Smoker  . Smokeless tobacco: Never Used  Substance and Sexual Activity  . Alcohol use: Yes    Comment: occ  . Drug use: No  . Sexual activity: Yes    Birth control/protection: None  Other Topics Concern  . Not on file  Social History Narrative  . Not on file   Social Determinants of Health   Financial  Resource Strain:   . Difficulty of Paying Living Expenses:   Food Insecurity:   . Worried About Charity fundraiser in the Last Year:   . Arboriculturist in the Last Year:   Transportation Needs:   . Film/video editor (Medical):   Marland Kitchen Lack of Transportation (Non-Medical):   Physical Activity:   . Days of Exercise per Week:   . Minutes of Exercise per Session:   Stress:   . Feeling of Stress :   Social Connections:   . Frequency of Communication with Friends and Family:   . Frequency of Social Gatherings with Friends and Family:   . Attends Religious Services:   . Active Member of Clubs or Organizations:   . Attends Archivist Meetings:   Marland Kitchen Marital Status:   Intimate Partner Violence:   . Fear of Current or Ex-Partner:   . Emotionally Abused:   Marland Kitchen Physically Abused:   . Sexually Abused:    Social History   Tobacco Use  Smoking Status Never Smoker  Smokeless Tobacco Never Used   Social History   Substance and Sexual Activity  Alcohol Use Yes   Comment: occ    Family History:  Family History  Problem Relation Age of Onset  . Hypertension Mother   . Hypertension Father   . Breast cancer Maternal Grandmother   . Heart disease Paternal Grandmother   . Cancer Paternal Grandfather        Colon    Past medical history, surgical history, medications, allergies, family history and social history reviewed with patient today and changes made to appropriate areas of the chart.   Review of Systems  Constitutional: Negative.   HENT: Negative.   Eyes: Negative.   Respiratory: Positive for shortness of breath. Negative for cough, hemoptysis, sputum production and wheezing.   Cardiovascular: Positive for palpitations. Negative for chest pain, orthopnea, claudication, leg swelling and PND.  Gastrointestinal: Positive for abdominal pain and constipation. Negative for blood in stool, diarrhea, heartburn, melena, nausea and vomiting.  Genitourinary: Negative.     Musculoskeletal: Positive for myalgias and neck pain. Negative for back pain, falls and joint pain.  Skin: Negative.   Neurological: Negative.   Endo/Heme/Allergies: Positive for environmental allergies. Negative for polydipsia. Bruises/bleeds easily.  Psychiatric/Behavioral: Negative.     All other ROS negative except what is listed above and in the HPI.      Objective:    BP 127/84 (BP Location: Left Arm, Patient Position: Sitting, Cuff Size: Normal)   Pulse 64   Temp 98.4 F (36.9 C) (Oral)   Ht 5' 2.99" (1.6 m)   Wt 168 lb 3.2 oz (76.3 kg)   LMP 05/22/2020 (Within Weeks)   SpO2 99%   BMI 29.80 kg/m   Wt Readings from Last 3 Encounters:  06/07/20 168 lb 3.2 oz (76.3 kg)  02/05/18 174 lb 12.8 oz (79.3 kg)  03/30/17 184 lb 6.4 oz (83.6 kg)    Physical Exam  Vitals and nursing note reviewed. Exam conducted with a chaperone present.  Constitutional:      General: She is not in acute distress.    Appearance: Normal appearance. She is not ill-appearing, toxic-appearing or diaphoretic.  HENT:     Head: Normocephalic and atraumatic.     Right Ear: Tympanic membrane, ear canal and external ear normal. There is no impacted cerumen.     Left Ear: Tympanic membrane, ear canal and external ear normal. There is no impacted cerumen.     Nose: Nose normal. No congestion or rhinorrhea.     Mouth/Throat:     Mouth: Mucous membranes are moist.     Pharynx: Oropharynx is clear. No oropharyngeal exudate or posterior oropharyngeal erythema.  Eyes:     General: No scleral icterus.       Right eye: No discharge.        Left eye: No discharge.     Extraocular Movements: Extraocular movements intact.     Conjunctiva/sclera: Conjunctivae normal.     Pupils: Pupils are equal, round, and reactive to light.  Neck:     Vascular: No carotid bruit.     Comments: Hypertonic R trap  Cardiovascular:     Rate and Rhythm: Normal rate and regular rhythm.     Pulses: Normal pulses.     Heart sounds:  No murmur heard.  No friction rub. No gallop.   Pulmonary:     Effort: Pulmonary effort is normal. No respiratory distress.     Breath sounds: Normal breath sounds. No stridor. No wheezing, rhonchi or rales.  Chest:     Chest wall: No tenderness.     Breasts:        Right: Normal. No swelling, bleeding, inverted nipple, mass, nipple discharge, skin change or tenderness.        Left: Normal. No swelling, bleeding, inverted nipple, mass, nipple discharge, skin change or tenderness.  Abdominal:     General: Abdomen is flat. Bowel sounds are normal. There is no distension.     Palpations: Abdomen is soft. There is no mass.     Tenderness: There is no abdominal tenderness. There is no right CVA tenderness, left CVA tenderness, guarding or rebound.     Hernia: No hernia is present.  Genitourinary:    Comments: Pelvic exam deferred with shared decision making Musculoskeletal:        General: No swelling, tenderness, deformity or signs of injury.     Cervical back: Normal range of motion and neck supple. No rigidity. No muscular tenderness.     Right lower leg: No edema.     Left lower leg: No edema.  Lymphadenopathy:     Cervical: No cervical adenopathy.     Upper Body:     Right upper body: No supraclavicular, axillary or pectoral adenopathy.     Left upper body: No supraclavicular, axillary or pectoral adenopathy.  Skin:    General: Skin is warm and dry.     Capillary Refill: Capillary refill takes less than 2 seconds.     Coloration: Skin is not jaundiced or pale.     Findings: No bruising, erythema, lesion or rash.  Neurological:     General: No focal deficit present.     Mental Status: She is alert and oriented to person, place, and time. Mental status is at baseline.     Cranial Nerves: No cranial nerve deficit.     Sensory: No sensory deficit.     Motor: No  weakness.     Coordination: Coordination normal.     Gait: Gait normal.     Deep Tendon Reflexes: Reflexes normal.    Psychiatric:        Mood and Affect: Mood normal.        Behavior: Behavior normal.        Thought Content: Thought content normal.        Judgment: Judgment normal.     Results for orders placed or performed in visit on 06/07/20  Urinalysis, Routine w reflex microscopic  Result Value Ref Range   Specific Gravity, UA 1.015 1.005 - 1.030   pH, UA 7.0 5.0 - 7.5   Color, UA Yellow Yellow   Appearance Ur Clear Clear   Leukocytes,UA Negative Negative   Protein,UA Negative Negative/Trace   Glucose, UA Negative Negative   Ketones, UA Negative Negative   RBC, UA Negative Negative   Bilirubin, UA Negative Negative   Urobilinogen, Ur 0.2 0.2 - 1.0 mg/dL   Nitrite, UA Negative Negative      Assessment & Plan:   Problem List Items Addressed This Visit      Digestive   Irritable bowel syndrome with constipation    Not doing well. Will start amitiza and recheck 1 month. Call with any concerns.       Relevant Medications   lubiprostone (AMITIZA) 8 MCG capsule     Musculoskeletal and Integument   Eczema    Under good control. Refills of triamcinolone refills given today.        Other Visit Diagnoses    Routine general medical examination at a health care facility    -  Primary   Vaccines updated. Screening labs checked today. Pap up to date. Mammogram ordered today. Continue diet and exercise. Call with any concerns.    Relevant Orders   CBC with Differential/Platelet   Comprehensive metabolic panel   Lipid Panel w/o Chol/HDL Ratio   TSH   Urinalysis, Routine w reflex microscopic (Completed)   Chronic neck pain       Will obtain x-ray. Await results. May need some PT pending results.    Relevant Orders   DG Cervical Spine Complete   History of vitamin D deficiency       Labs drawn today. Await results.    Relevant Orders   VITAMIN D 25 Hydroxy (Vit-D Deficiency, Fractures)   Routine screening for STI (sexually transmitted infection)       Labs drawn today. Await  results.    Relevant Orders   RPR   Hepatitis, Acute   GC/Chlamydia Probe Amp   HSV(herpes simplex vrs) 1+2 ab-IgG   Encounter for screening mammogram for malignant neoplasm of breast       Mammogram ordered today.    Relevant Orders   MM 3D SCREEN BREAST BILATERAL   Need for Tdap vaccination       Tdap given today.   Relevant Orders   Tdap vaccine greater than or equal to 7yo IM (Completed)   Screening for HIV (human immunodeficiency virus)       Labs drawn today. Await results.    Relevant Orders   HIV Antibody (routine testing w rflx)   Need for hepatitis C screening test       Labs drawn today. Await results.    Relevant Orders   Hepatitis C antibody       Follow up plan: Return in about 4 weeks (around 07/05/2020) for follow up constipation.   LABORATORY TESTING:  -  Pap smear: up to date  IMMUNIZATIONS:   - Tdap: Tetanus vaccination status reviewed: last tetanus booster within 10 years. - Influenza: Up to date - COVID: Up to date  SCREENING: -Mammogram: Ordered today   PATIENT COUNSELING:   Advised to take 1 mg of folate supplement per day if capable of pregnancy.   Sexuality: Discussed sexually transmitted diseases, partner selection, use of condoms, avoidance of unintended pregnancy  and contraceptive alternatives.   Advised to avoid cigarette smoking.  I discussed with the patient that most people either abstain from alcohol or drink within safe limits (<=14/week and <=4 drinks/occasion for males, <=7/weeks and <= 3 drinks/occasion for females) and that the risk for alcohol disorders and other health effects rises proportionally with the number of drinks per week and how often a drinker exceeds daily limits.  Discussed cessation/primary prevention of drug use and availability of treatment for abuse.   Diet: Encouraged to adjust caloric intake to maintain  or achieve ideal body weight, to reduce intake of dietary saturated fat and total fat, to limit sodium  intake by avoiding high sodium foods and not adding table salt, and to maintain adequate dietary potassium and calcium preferably from fresh fruits, vegetables, and low-fat dairy products.    stressed the importance of regular exercise  Injury prevention: Discussed safety belts, safety helmets, smoke detector, smoking near bedding or upholstery.   Dental health: Discussed importance of regular tooth brushing, flossing, and dental visits.    NEXT PREVENTATIVE PHYSICAL DUE IN 1 YEAR. Return in about 4 weeks (around 07/05/2020) for follow up constipation.

## 2020-06-07 NOTE — Assessment & Plan Note (Signed)
Not doing well. Will start amitiza and recheck 1 month. Call with any concerns.

## 2020-06-07 NOTE — Assessment & Plan Note (Signed)
Under good control. Refills of triamcinolone refills given today.

## 2020-06-08 LAB — CBC WITH DIFFERENTIAL/PLATELET
Basophils Absolute: 0 10*3/uL (ref 0.0–0.2)
Basos: 1 %
EOS (ABSOLUTE): 0.2 10*3/uL (ref 0.0–0.4)
Eos: 4 %
Hematocrit: 40 % (ref 34.0–46.6)
Hemoglobin: 13.5 g/dL (ref 11.1–15.9)
Immature Grans (Abs): 0 10*3/uL (ref 0.0–0.1)
Immature Granulocytes: 0 %
Lymphocytes Absolute: 0.9 10*3/uL (ref 0.7–3.1)
Lymphs: 20 %
MCH: 32.5 pg (ref 26.6–33.0)
MCHC: 33.8 g/dL (ref 31.5–35.7)
MCV: 96 fL (ref 79–97)
Monocytes Absolute: 0.3 10*3/uL (ref 0.1–0.9)
Monocytes: 8 %
Neutrophils Absolute: 2.9 10*3/uL (ref 1.4–7.0)
Neutrophils: 67 %
Platelets: 202 10*3/uL (ref 150–450)
RBC: 4.16 x10E6/uL (ref 3.77–5.28)
RDW: 12.2 % (ref 11.7–15.4)
WBC: 4.3 10*3/uL (ref 3.4–10.8)

## 2020-06-08 LAB — COMPREHENSIVE METABOLIC PANEL
ALT: 6 IU/L (ref 0–32)
AST: 17 IU/L (ref 0–40)
Albumin/Globulin Ratio: 2 (ref 1.2–2.2)
Albumin: 4.3 g/dL (ref 3.8–4.8)
Alkaline Phosphatase: 66 IU/L (ref 48–121)
BUN/Creatinine Ratio: 12 (ref 9–23)
BUN: 12 mg/dL (ref 6–24)
Bilirubin Total: 0.5 mg/dL (ref 0.0–1.2)
CO2: 24 mmol/L (ref 20–29)
Calcium: 9.3 mg/dL (ref 8.7–10.2)
Chloride: 100 mmol/L (ref 96–106)
Creatinine, Ser: 0.99 mg/dL (ref 0.57–1.00)
GFR calc Af Amer: 82 mL/min/{1.73_m2} (ref >59)
GFR calc non Af Amer: 72 mL/min/{1.73_m2} (ref >59)
Globulin, Total: 2.2 g/dL (ref 1.5–4.5)
Glucose: 82 mg/dL (ref 65–99)
Potassium: 4.1 mmol/L (ref 3.5–5.2)
Sodium: 137 mmol/L (ref 134–144)
Total Protein: 6.5 g/dL (ref 6.0–8.5)

## 2020-06-08 LAB — HSV(HERPES SIMPLEX VRS) I + II AB-IGG
HSV 1 Glycoprotein G Ab, IgG: <0.91 index (ref 0.00–0.90)
HSV 2 IgG, Type Spec: <0.91 index (ref 0.00–0.90)

## 2020-06-08 LAB — LIPID PANEL W/O CHOL/HDL RATIO
Cholesterol, Total: 150 mg/dL (ref 100–199)
HDL: 88 mg/dL (ref >39)
LDL Chol Calc (NIH): 53 mg/dL (ref 0–99)
Triglycerides: 38 mg/dL (ref 0–149)
VLDL Cholesterol Cal: 9 mg/dL (ref 5–40)

## 2020-06-08 LAB — HIV ANTIBODY (ROUTINE TESTING W REFLEX): HIV Screen 4th Generation wRfx: NONREACTIVE

## 2020-06-08 LAB — HEPATITIS PANEL, ACUTE
Hep A IgM: NEGATIVE
Hep B C IgM: NEGATIVE
Hep C Virus Ab: <0.1 s/co ratio (ref 0.0–0.9)
Hepatitis B Surface Ag: NEGATIVE

## 2020-06-08 LAB — TSH: TSH: 2.35 u[IU]/mL (ref 0.450–4.500)

## 2020-06-08 LAB — RPR: RPR Ser Ql: NONREACTIVE

## 2020-06-08 LAB — VITAMIN D 25 HYDROXY (VIT D DEFICIENCY, FRACTURES): Vit D, 25-Hydroxy: 34.9 ng/mL (ref 30.0–100.0)

## 2020-06-09 LAB — GC/CHLAMYDIA PROBE AMP
Chlamydia trachomatis, NAA: NEGATIVE
Neisseria Gonorrhoeae by PCR: NEGATIVE

## 2020-06-11 ENCOUNTER — Encounter: Payer: Self-pay | Admitting: Family Medicine

## 2020-06-11 DIAGNOSIS — M542 Cervicalgia: Secondary | ICD-10-CM

## 2020-06-11 DIAGNOSIS — R41840 Attention and concentration deficit: Secondary | ICD-10-CM

## 2020-06-11 DIAGNOSIS — G8929 Other chronic pain: Secondary | ICD-10-CM

## 2020-06-12 MED ORDER — LINACLOTIDE 145 MCG PO CAPS: 145.0000 ug | ORAL_CAPSULE | Freq: Every day | ORAL | 3 refills | Status: DC

## 2020-06-13 ENCOUNTER — Encounter: Payer: BLUE CROSS/BLUE SHIELD | Admitting: Family Medicine

## 2020-06-25 ENCOUNTER — Ambulatory Visit: Payer: Managed Care, Other (non HMO) | Attending: Family Medicine | Admitting: Physical Therapy

## 2020-06-25 ENCOUNTER — Other Ambulatory Visit: Payer: Self-pay

## 2020-06-25 ENCOUNTER — Encounter: Payer: Self-pay | Admitting: Physical Therapy

## 2020-06-25 DIAGNOSIS — R519 Headache, unspecified: Secondary | ICD-10-CM

## 2020-06-25 DIAGNOSIS — M5412 Radiculopathy, cervical region: Secondary | ICD-10-CM | POA: Insufficient documentation

## 2020-06-25 DIAGNOSIS — M542 Cervicalgia: Secondary | ICD-10-CM | POA: Diagnosis present

## 2020-06-25 DIAGNOSIS — R202 Paresthesia of skin: Secondary | ICD-10-CM | POA: Diagnosis present
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
## 2020-06-28 ENCOUNTER — Ambulatory Visit: Payer: Managed Care, Other (non HMO) | Admitting: Physical Therapy

## 2020-07-01 NOTE — Addendum Note (Signed)
Addended by: Valerie Roys on: 07/01/2020 05:14 PM   Modules accepted: Orders

## 2020-07-02 ENCOUNTER — Other Ambulatory Visit: Payer: Self-pay

## 2020-07-02 ENCOUNTER — Ambulatory Visit: Payer: Managed Care, Other (non HMO) | Admitting: Physical Therapy

## 2020-07-02 DIAGNOSIS — R519 Headache, unspecified: Secondary | ICD-10-CM

## 2020-07-02 DIAGNOSIS — R202 Paresthesia of skin: Secondary | ICD-10-CM

## 2020-07-02 DIAGNOSIS — M542 Cervicalgia: Secondary | ICD-10-CM | POA: Diagnosis not present

## 2020-07-02 DIAGNOSIS — M5412 Radiculopathy, cervical region: Secondary | ICD-10-CM
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
## 2020-07-03 ENCOUNTER — Encounter: Payer: Self-pay | Admitting: Physical Therapy

## 2020-07-05 ENCOUNTER — Telehealth: Payer: Managed Care, Other (non HMO) | Admitting: Family Medicine

## 2020-07-05 ENCOUNTER — Ambulatory Visit: Payer: Managed Care, Other (non HMO) | Admitting: Physical Therapy

## 2020-07-05 ENCOUNTER — Encounter: Payer: Self-pay | Admitting: Physical Therapy

## 2020-07-05 ENCOUNTER — Other Ambulatory Visit: Payer: Self-pay

## 2020-07-05 DIAGNOSIS — R202 Paresthesia of skin: Secondary | ICD-10-CM

## 2020-07-05 DIAGNOSIS — M542 Cervicalgia: Secondary | ICD-10-CM | POA: Diagnosis not present

## 2020-07-05 DIAGNOSIS — R519 Headache, unspecified: Secondary | ICD-10-CM

## 2020-07-05 DIAGNOSIS — M5412 Radiculopathy, cervical region: Secondary | ICD-10-CM

## 2020-07-05 NOTE — Therapy (Signed)
[facility] [address], [place], [address] Phone: [phone]   Fax:  [phone]  Physical Therapy Treatment  Patient Details  Name: Marilyn Francis MRN: [id number] Date of Birth: 1980/12/29 Referring Provider (PT): Valerie Roys, DO   Encounter Date: 07/05/2020   PT End of Session - 07/06/20 1216    Visit Number 3    Number of Visits 24    Date for PT Re-Evaluation 09/17/20    Authorization Type [employer] reporting period from 06/25/2020    Authorization - Visit Number 3    Authorization - Number of Visits 10    Progress Note Due on Visit 10    PT Start Time 1037    PT Stop Time 1120    PT Time Calculation (min) 43 min    Activity Tolerance Patient limited by pain;Patient tolerated treatment well    Behavior During Therapy [facility] for tasks assessed/performed           Past Medical History:  Diagnosis Date  . Allergic rhinitis   . Bipolar 2 disorder [facility])     Past Surgical History:  Procedure Laterality Date  . birth mark removal    . DENTAL SURGERY      There were no vitals filed for this visit.   Subjective Assessment - 07/05/20 1041    Subjective Patient reports she still has elevated pain like she did when she arrived at her last treatment session Pain down both legs to knees, R > L, but new pain down the R side of the body from the neck to the leg. Also continues to have paresthesia in the R hand. she also has a headache bilateral rams horn pattern. All of this is 4/10 pain today. Headache that worsened following last session went back to baseline in a few hours.    Pertinent History Patient is a 39 y.o. female who presents to outpatient physical therapy with a referral for medical diagnosis chronic neck pain. This patient's chief complaints consist of B neck pain, R > L, with intermittent radiation and paresthesia to the R fingers and headaches leading to the following functional deficits:  headaches can make her unable to do things, can struggle to lift things on the job, cannot support herself doing pull ups/while exercising, ROM can be an issue, sleeping, dressing, reaching overhead.Relevant past medical history and comorbidities include bipolar 2.  Patient denies hx of cancer, stroke, seizures, lung problem, major cardiac events, diabetes, unexplained weight loss, changes in bowel or bladder problems, new onset stumbling or dropping things    Limitations Lifting;House hold activities   headaches can make her unable to do things, can struggle to lift things on the job, cannot support herself doing pull ups/while exercising, ROM can be an issue, sleeping, dressing, reaching overhead.   Diagnostic tests neck x-ray normal    Patient Stated Goals to stop having pain and weakness during functional activities    Currently in Pain? Yes    Pain Score 4     Pain Onset More than a month ago           OBJECTIVE   Lhermitte's Sign = negative (positive would implicate upper cervical spinal cord or lower brainstem)  Lower Limb Neurodynamic/Tension Tests: - Straight Leg Raise (LE neural tension) = R positve! L positive  Upper Limb Neurodynamic/Tension Tests:  Supine:  - Median Nerve: L positive (wrist and elbow; wrist flex, elbow flex causes head pain as well);  R positive (left worst) - Radial Nerve:L positive (elbow, and wrist); R positive (elbow and wrist, R worse) - Ulnar nerve: L positive (wrist and elbow); R positive (wrist and elbow) equal both sides.  L side much worse into head following at shoulder and upper trap.   Upper Motor Neuron Screen (positive suggests CNS involvement):  - Hoffman's sign: R = POSITIVE, L = Negative - Babinski and (ankle) Clonus negative bilaterally.   Deep Tendon Reflexes:  Somewhat brisk and approaching 3+ at bilateral biceps brachii (C5, C6), Triceps brachii (C7), Quadriceps (L4) and Achilles reflex (S1).   Palpation - Pressure to L suboccipital  region and upper cervical spine causes pain/symptoms at left knee and toe. Removing pressure to this region of neck relives LE symptoms. (suggests spinal cord involvement due to lack of peripheral nerve connection between C-spine and LE)   Accessory Motion: - CPA (anterior posterior pressure to vertebral segments) to lower lumbar spine causes increased paresthesia in bilateral LE (could be produced by peripheral or central nervous system involvement, but bilateral symptoms this easily produced in the setting of the above symptoms is notable).      TREATMENT:    Therapeutic exercise: to centralize symptoms and improve ROM, strength, muscular endurance, and activity tolerance required for successful completion of functional activities.  - re-assessment of neurologic and related signs (most relevant results above).  - Education about findings, concerns, re-assurances, what to look for as a sign more urgent care is needed (including progressive motor loss, loss of bowel/bladder control), and suggestion to return to referring clinician for further medical assessment.   Manual therapy: to reduce pain and tissue tension, improve range of motion, neuromodulation, in order to promote improved ability to complete functional activities.  Patient in supine with feet elevated (reports increased LE pain following and reports that she is usually more comfortable with hips extended - suggest no bolster next session).  - STM including sustained pressure for trigger point release to upper cervical spine in attempt to decrease headache. Very tight and pressure reproduces headache. Also noted for production of L toe and knee symptoms with pressure to L upper cervical spine and relief from these symptoms with removal of pressure. Avoided this region once this response was discovered. Patient reported reduction in headache but not abolishment of headache following manual therapy.  - educated pt on how to use LAX  (lacrosse) balls at suboccipital region for self-release.     PT Education - 07/06/20 1216    Education Details intervention purpose/form. Self management techniques. Findings from exam and reccomendations for follow up.    Person(s) Educated Patient    Methods Explanation;Demonstration;Tactile cues;Verbal cues    Comprehension Verbalized understanding;Returned demonstration;Verbal cues required;Tactile cues required;Need further instruction            PT Short Term Goals - 06/25/20 1957      PT SHORT TERM GOAL #1   Title Be independent with initial home exercise program for self-management of symptoms.    Baseline to be established visit 2 as appropriate (06/25/2020);    Time 3    Period Weeks    Status New    Target Date 07/16/20             PT Long Term Goals - 06/25/20 1926      PT LONG TERM GOAL #1   Title Be independent with a long-term home exercise program for self-management of symptoms.    Baseline to be established session 2 (06/25/2120);  Time 12    Period Weeks    Status New   TARGET DATE FOR ALL LONG TERM GOALS: 09/17/2020     PT LONG TERM GOAL #2   Title Demonstrate improved FOTO to equal or greater than 71 by visit #10 to demonstrate improvement in overall condition and self-reported functional ability.    Baseline 66 (06/25/2020);    Time 12    Period Weeks    Status New      PT LONG TERM GOAL #3   Title Reduce pain and paresthesia with functional activities to equal or less than 1/10 to allow patient to complete usual activities including ADLs, IADLs, and social engagement with less difficulty.    Baseline up to 7/10 (06/25/2020);    Time 12    Period Weeks    Status New      PT LONG TERM GOAL #4   Title Patient will demonstrate B UE MMT 5/5 with same quality and without report of "feeling weak" on the right compared to left to allow her to complete her work and physical training with less difficulty.    Baseline feels weak and has weak quality on  R vs L, reports limitations duirng training and work (06/25/2020);    Time 12    Period Weeks    Status New      PT LONG TERM GOAL #5   Title Complete community, work and/or recreational activities without limitation due to current condition.    Baseline headaches can make her unable to do things, can struggle to lift things on the job, cannot support herself doing pull ups/while exercising, ROM can be an issue, sleeping, dressing, reaching overhead (06/25/2020);    Time 12    Period Weeks    Status New                 Plan - 07/06/20 1248    Clinical Impression Statement Patient tolerated treatment well overall this session and reported reduction in pain for the first time this session after manual therapy to the suboccipitals that reduced her headache. Continues to have signs of central nervous system involvement including positive Hoffman's Sign on R UE, increased DTR sensitivity globally, production of L LE symptoms with pressure to L upper cervical spine. Patient also has neural tension in all extremities and bilateral increase in LE symptoms with CPA to lumbar spine which may be relevant to CNS in the context of the other positive tests. Of note patient is negative for Lhermitte's Sign (positive would implicate upper spinal cord or brainstem), denies bowel/bladder incontinence (but does have history of IBS with constipation). She does not demonstrate progressive motor deficit or other signs requiring urgent intervention. Patient did get some relief from PT this session and PT interventions for this patient have not been exhausted. It appears she would benefit from continued PT at this point to continue to address symptoms and any underlying musculoskeletal dysfunction responsive to PT, but she would also benefit from further medical evaluation due to multiple signs that suggest CNS may be involved. Patient would benefit from continued management of limiting condition by skilled physical  therapist to address remaining impairments and functional limitations to work towards stated goals and return to PLOF or maximal functional independence.    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   Stability/Clinical Decision Making Evolving/Moderate complexity    Rehab Potential Fair    PT Frequency 2x / week    PT Duration 12 weeks    PT Treatment/Interventions ADLs/Self Care Home Management;Biofeedback;Cryotherapy;Moist Heat;Traction;Therapeutic activities;Therapeutic exercise;Neuromuscular re-education;Patient/family education;Manual techniques;Passive range of motion;Dry needling;Joint Manipulations;Spinal Manipulations    PT Next Visit Plan Manual therapy for head ache releif, HEP and nerve slider techniques    PT Home Exercise Plan To be established as appropriate    Recommended Other Services Return to referring clinician for further medical evaluation due to signs of possible CNS involvement    Consulted and Agree with Plan of Care Patient           Patient will benefit from skilled therapeutic intervention in order to improve the following deficits and impairments:  Increased fascial restricitons, Pain, Increased muscle spasms, Decreased mobility, Decreased activity tolerance, Decreased endurance, Decreased range of motion, Decreased strength, Impaired perceived functional ability, Impaired UE functional use, Impaired  flexibility  Visit Diagnosis: Cervicalgia  Radiculopathy, cervical region  Nonintractable headache, unspecified chronicity pattern, unspecified headache type  Paresthesia of skin     Problem List Patient Active Problem List   Diagnosis Date Noted  . Irritable bowel syndrome with constipation 06/07/2020  . Eczema 06/07/2020  . Bipolar 2 disorder ([place])   . Allergic rhinitis     Everlean Alstrom. Graylon Good, PT, DPT 07/06/20, 12:51 PM  [facility] [address] [place], [address] Phone: [phone]   Fax:  [phone]  Name: Marilyn Francis MRN: [id number] Date of Birth: 1981/01/16

## 2020-07-08 ENCOUNTER — Other Ambulatory Visit: Payer: Self-pay | Admitting: Family Medicine

## 2020-07-08 DIAGNOSIS — G8929 Other chronic pain: Secondary | ICD-10-CM

## 2020-07-09 ENCOUNTER — Ambulatory Visit: Payer: Managed Care, Other (non HMO) | Admitting: Physical Therapy

## 2020-07-11 ENCOUNTER — Telehealth: Payer: Self-pay | Admitting: Family Medicine

## 2020-07-11 NOTE — Telephone Encounter (Signed)
Insurance Company denied Imaging order. They would like more clinical information. Marilyn Francis states they would like to know what treatments the pt has already tried and if the pt has had a follow up after treatments. Please advise.

## 2020-07-12 ENCOUNTER — Other Ambulatory Visit: Payer: Self-pay

## 2020-07-12 ENCOUNTER — Encounter: Payer: Self-pay | Admitting: Physical Therapy

## 2020-07-12 ENCOUNTER — Ambulatory Visit: Payer: Managed Care, Other (non HMO) | Attending: Family Medicine | Admitting: Physical Therapy

## 2020-07-12 DIAGNOSIS — M5412 Radiculopathy, cervical region: Secondary | ICD-10-CM | POA: Diagnosis present

## 2020-07-12 DIAGNOSIS — R519 Headache, unspecified: Secondary | ICD-10-CM

## 2020-07-12 DIAGNOSIS — R202 Paresthesia of skin: Secondary | ICD-10-CM | POA: Diagnosis present

## 2020-07-12 DIAGNOSIS — M542 Cervicalgia: Secondary | ICD-10-CM | POA: Insufficient documentation

## 2020-07-12 NOTE — Therapy (Signed)
[facility] [address] [place], [address] Phone: [phone]   Fax:  [phone]  Physical Therapy Treatment  Patient Details  Name: Markeeta Scalf MRN: [id number] Date of Birth: 1981/10/02 Referring Provider (PT): Valerie Roys, DO   Encounter Date: 07/12/2020   PT End of Session - 07/12/20 1400    Visit Number 4    Number of Visits 24    Date for PT Re-Evaluation 09/17/20    Authorization Type [employer] reporting period from 06/25/2020    Authorization - Visit Number 4    Authorization - Number of Visits 10    Progress Note Due on Visit 10    PT Start Time 2248    PT Stop Time 1113    PT Time Calculation (min) 33 min    Activity Tolerance Patient limited by pain    Behavior During Therapy [facility] for tasks assessed/performed           Past Medical History:  Diagnosis Date  . Allergic rhinitis   . Bipolar 2 disorder [facility])     Past Surgical History:  Procedure Laterality Date  . birth mark removal    . DENTAL SURGERY      There were no vitals filed for this visit.   Subjective Assessment - 07/12/20 1043    Subjective Patient reports her pain is 4/10 today. She states she felt better for  several hours, then right side started feeling worse than prior to PT and her R arm was the worst, unable to extend R elbow (feels blocked). It came on gradually like it needed to crack and over 2 days it changed form that to an overall feeling of tightness over bicep and anterior forearm and like impinged in the posterior elbow. Headache came back to the level is was previously. L LE symptoms produced with pressure to neck did not return. Currrent pain is in headache, R arm, low back, neck, R leg to feet. Only places with no pain is in L arm and leg.    Pertinent History Patient is a 39 y.o. female who presents to outpatient physical therapy with a referral for medical diagnosis chronic neck pain. This patient's  chief complaints consist of B neck pain, R > L, with intermittent radiation and paresthesia to the R fingers and headaches leading to the following functional deficits: headaches can make her unable to do things, can struggle to lift things on the job, cannot support herself doing pull ups/while exercising, ROM can be an issue, sleeping, dressing, reaching overhead.Relevant past medical history and comorbidities include bipolar 2.  Patient denies hx of cancer, stroke, seizures, lung problem, major cardiac events, diabetes, unexplained weight loss, changes in bowel or bladder problems, new onset stumbling or dropping things    Limitations Lifting;House hold activities   headaches can make her unable to do things, can struggle to lift things on the job, cannot support herself doing pull ups/while exercising, ROM can be an issue, sleeping, dressing, reaching overhead.   Diagnostic tests neck x-ray normal    Patient Stated Goals to stop having pain and weakness during functional activities    Currently in Pain? Yes    Pain Score 4     Pain Onset More than a month ago             TREATMENT:   Manual therapy:to reduce pain and tissue tension, improve range of motion, neuromodulation, in order to promote improved ability to  complete functional activities. - STM including sustained pressure for trigger point release to upper cervical spine in attempt to decrease headache. TTP and worse headache during STM, no worse after.  - sustained cervical retraction in supine with clinician overpressure x 3 min with break at 1.5 min. Patient reported feeling okay during but afterwards states pain in her right arm and lower neck started about half way through. Headache unchanged.   Therapeutic exercise: to centralize symptoms and improve ROM, strength, muscular endurance, and activity tolerance required for successful completion of functional activities.  - (following manual) supine cervical retraction/chin  tuck on flat surface to stretch upper cervical spine, x 6 reps increasing pain. Discontinued and allowed to rest in supine with head resting on flat table reported continued increasing of headache, so discontinued position. Headache remained worse after sitting up and pt was slightly lightheaded which is not usual for her. Dizziness gradually improved and went away with time.    PT Education - 07/12/20 1400    Education Details intervention purpose    Person(s) Educated Patient    Methods Explanation;Demonstration;Tactile cues;Verbal cues    Comprehension Verbalized understanding;Returned demonstration;Verbal cues required;Tactile cues required            PT Short Term Goals - 06/25/20 1957      PT SHORT TERM GOAL #1   Title Be independent with initial home exercise program for self-management of symptoms.    Baseline to be established visit 2 as appropriate (06/25/2020);    Time 3    Period Weeks    Status New    Target Date 07/16/20             PT Long Term Goals - 06/25/20 1926      PT LONG TERM GOAL #1   Title Be independent with a long-term home exercise program for self-management of symptoms.    Baseline to be established session 2 (06/25/2120);    Time 12    Period Weeks    Status New   TARGET DATE FOR ALL LONG TERM GOALS: 09/17/2020     PT LONG TERM GOAL #2   Title Demonstrate improved FOTO to equal or greater than 71 by visit #10 to demonstrate improvement in overall condition and self-reported functional ability.    Baseline 66 (06/25/2020);    Time 12    Period Weeks    Status New      PT LONG TERM GOAL #3   Title Reduce pain and paresthesia with functional activities to equal or less than 1/10 to allow patient to complete usual activities including ADLs, IADLs, and social engagement with less difficulty.    Baseline up to 7/10 (06/25/2020);    Time 12    Period Weeks    Status New      PT LONG TERM GOAL #4   Title Patient will demonstrate B UE MMT 5/5 with  same quality and without report of "feeling weak" on the right compared to left to allow her to complete her work and physical training with less difficulty.    Baseline feels weak and has weak quality on R vs L, reports limitations duirng training and work (06/25/2020);    Time 12    Period Weeks    Status New      PT LONG TERM GOAL #5   Title Complete community, work and/or recreational activities without limitation due to current condition.    Baseline headaches can make her unable to do things, can struggle to  lift things on the job, cannot support herself doing pull ups/while exercising, ROM can be an issue, sleeping, dressing, reaching overhead (06/25/2020);    Time 12    Period Weeks    Status New                 Plan - 07/12/20 1410    Clinical Impression Statement Patient did not have a reduction in headache or any symptoms this session with interventions. She reported increased headache with soft tissue manual therapy but back to baseline after. Reported increased R arm and lower neck pain with sustained cervical spine retraction with clinician overpressure in supine that remained worse after. Headache no worse with this position. Headache worsened with supine cervical retraction on flat mat and continued to worsen with resting in supine position. Reported lightheadedness when she returned to sitting that dissipated over time. Patient overall worse in headache and R arm by end of session. So far PT has not produced a lasting favorable result. Recommend pt continue to seek further medical evaluation from referring clinician.    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   Stability/Clinical Decision Making Evolving/Moderate complexity    Rehab Potential Fair    PT Frequency 2x / week    PT Duration 12 weeks    PT Treatment/Interventions ADLs/Self Care Home Management;Biofeedback;Cryotherapy;Moist Heat;Traction;Therapeutic activities;Therapeutic exercise;Neuromuscular re-education;Patient/family education;Manual techniques;Passive range of motion;Dry needling;Joint Manipulations;Spinal Manipulations    PT Next Visit Plan Manual therapy for head ache releif, HEP and nerve slider techniques    PT Home Exercise Plan To be established as appropriate    Recommended Other Services Recommend pt continue to seek further medical evaluation from referring clinician.    Consulted and Agree with Plan of Care Patient           Patient will benefit from skilled therapeutic intervention in order to improve the following deficits and impairments:  Increased fascial restricitons, Pain, Increased muscle spasms, Decreased mobility, Decreased activity tolerance, Decreased endurance, Decreased range of motion, Decreased strength, Impaired perceived functional ability, Impaired UE functional use, Impaired flexibility  Visit Diagnosis: Cervicalgia  Radiculopathy, cervical region  Nonintractable headache, unspecified chronicity pattern, unspecified headache type  Paresthesia of skin     Problem List Patient Active Problem List   Diagnosis Date Noted  . Irritable bowel syndrome with constipation 06/07/2020  . Eczema 06/07/2020  . Bipolar 2 disorder ([place])   . Allergic rhinitis     Everlean Alstrom. Graylon Good, PT, DPT 07/12/20, 2:11 PM  [place] [facility] [address], [place], [address] Phone: [phone]   Fax:  [phone]  Name: Garrie Elenes MRN: [id number] Date of Birth: 02-29-1981

## 2020-07-16 ENCOUNTER — Ambulatory Visit: Payer: Managed Care, Other (non HMO) | Admitting: Physical Therapy

## 2020-07-16 ENCOUNTER — Encounter: Payer: Self-pay | Admitting: Physical Therapy

## 2020-07-16 ENCOUNTER — Other Ambulatory Visit: Payer: Self-pay

## 2020-07-16 DIAGNOSIS — M5412 Radiculopathy, cervical region: Secondary | ICD-10-CM

## 2020-07-16 DIAGNOSIS — R202 Paresthesia of skin: Secondary | ICD-10-CM

## 2020-07-16 DIAGNOSIS — R519 Headache, unspecified: Secondary | ICD-10-CM

## 2020-07-16 DIAGNOSIS — M542 Cervicalgia: Secondary | ICD-10-CM

## 2020-07-16 NOTE — Therapy (Signed)
[facility] [address] [place], [address] Phone: [phone]   Fax:  [phone]  Physical Therapy Treatment  Patient Details  Name: Marilyn Francis MRN: [id number] Date of Birth: Jan 19, 1981 Referring Provider (PT): Valerie Roys, DO   Encounter Date: 07/16/2020   PT End of Session - 07/16/20 1028    Visit Number 5    Number of Visits 24    Date for PT Re-Evaluation 09/17/20    Authorization Type [employer] reporting period from 06/25/2020    Authorization - Visit Number 5    Authorization - Number of Visits 10    Progress Note Due on Visit 10    PT Start Time 0950    PT Stop Time 1000    PT Time Calculation (min) 10 min    Activity Tolerance Other (comment)   education provided only   Behavior During Therapy [facility] for tasks assessed/performed           Past Medical History:  Diagnosis Date  . Allergic rhinitis   . Bipolar 2 disorder [facility])     Past Surgical History:  Procedure Laterality Date  . birth mark removal    . DENTAL SURGERY      There were no vitals filed for this visit.   Subjective Assessment - 07/16/20 0951    Subjective Patient reports she is having a "good day" today upon arrival. Rates her pain at 3/10 in her headache, neck, R UE, and back. She states following last treatment session she continued to feel "a little fuzzy" (referring to lightheadedness that started during her last PT session) for a couple of hours. Then her "fuzziness" improved but she had widespread increase in soreness with radiation throughout her body. It took two days for her head to fully clear from the "fuzzy" feeling. This morning, she awoke like she had aggravated something by sleeping wrong and felt tight between her scapulae, but actually this seemed to help the rest of the pain in her body, leading to a good day. She states her increased symptoms following last treatment session did not hinder her work  function any more than it has already been limited by her condition.    Pertinent History Patient is a 39 y.o. female who presents to outpatient physical therapy with a referral for medical diagnosis chronic neck pain. This patient's chief complaints consist of B neck pain, R > L, with intermittent radiation and paresthesia to the R fingers and headaches leading to the following functional deficits: headaches can make her unable to do things, can struggle to lift things on the job, cannot support herself doing pull ups/while exercising, ROM can be an issue, sleeping, dressing, reaching overhead.Relevant past medical history and comorbidities include bipolar 2.  Patient denies hx of cancer, stroke, seizures, lung problem, major cardiac events, diabetes, unexplained weight loss, changes in bowel or bladder problems, new onset stumbling or dropping things    Limitations Lifting;House hold activities   headaches can make her unable to do things, can struggle to lift things on the job, cannot support herself doing pull ups/while exercising, ROM can be an issue, sleeping, dressing, reaching overhead.   Diagnostic tests neck x-ray normal    Patient Stated Goals to stop having pain and weakness during functional activities    Currently in Pain? Yes    Pain Score 3     Pain Location Neck   neck, headache, R UE, low back   Pain  Type Chronic pain    Pain Onset More than a month ago           No intervention provided this session after discussing subjective report. Patient continues to have widespread worsening of symptoms including lasting lightheadedness last session that are not consistent with expected response to PT and pt shows signs of CNS involvement (see previous documentation). Recommend pt have further medical evaluation prior to continuing PT to assure safety and investigate for causes of symptoms not addressable by PT. Patient has attended all appointments as expected and participated fully in all PT  sessions. She appears to have no positive response to PT at this point and has signs and symptoms concerning for more serious pathology and is being discharged from PT at this time and referred back to physician for medical follow up. She has not been able to progress towards goals due to the reasons above.    PT Education - 07/16/20 1028    Education Details progress, chronic pain education, advice and reasoning about why further medical evaluation is warranted and PT will be discontinued at this point.    Person(s) Educated Patient    Methods Explanation    Comprehension Verbalized understanding            PT Short Term Goals - 06/25/20 1957      PT SHORT TERM GOAL #1   Title Be independent with initial home exercise program for self-management of symptoms.    Baseline to be established visit 2 as appropriate (06/25/2020);    Time 3    Period Weeks    Status New    Target Date 07/16/20             PT Long Term Goals - 06/25/20 1926      PT LONG TERM GOAL #1   Title Be independent with a long-term home exercise program for self-management of symptoms.    Baseline to be established session 2 (06/25/2120);    Time 12    Period Weeks    Status New   TARGET DATE FOR ALL LONG TERM GOALS: 09/17/2020     PT LONG TERM GOAL #2   Title Demonstrate improved FOTO to equal or greater than 71 by visit #10 to demonstrate improvement in overall condition and self-reported functional ability.    Baseline 66 (06/25/2020);    Time 12    Period Weeks    Status New      PT LONG TERM GOAL #3   Title Reduce pain and paresthesia with functional activities to equal or less than 1/10 to allow patient to complete usual activities including ADLs, IADLs, and social engagement with less difficulty.    Baseline up to 7/10 (06/25/2020);    Time 12    Period Weeks    Status New      PT LONG TERM GOAL #4   Title Patient will demonstrate B UE MMT 5/5 with same quality and without report of "feeling  weak" on the right compared to left to allow her to complete her work and physical training with less difficulty.    Baseline feels weak and has weak quality on R vs L, reports limitations duirng training and work (06/25/2020);    Time 12    Period Weeks    Status New      PT LONG TERM GOAL #5   Title Complete community, work and/or recreational activities without limitation due to current condition.    Baseline headaches can make her  unable to do things, can struggle to lift things on the job, cannot support herself doing pull ups/while exercising, ROM can be an issue, sleeping, dressing, reaching overhead (06/25/2020);    Time 12    Period Weeks    Status New                 Plan - 07/16/20 1033    Clinical Impression Statement No intervention provided this session after discussing subjective report. Patient continues to have widespread worsening of symptoms including lasting lightheadedness last session that are not consistent with expected response to PT and pt shows signs of CNS involvement (see previous documentation). Recommend pt have further medical evaluation prior to continuing PT to assure safety and investigate for causes of symptoms not addressable by PT. Patient has attended all appointments as expected and participated fully in all PT sessions. She appears to have no positive response to PT at this point and has signs and symptoms concerning for more serious pathology and is being discharged from PT at this time and referred back to physician for medical follow up. She has not been able to progress towards goals due to the reasons above.    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   Stability/Clinical Decision Making Evolving/Moderate complexity    Rehab Potential Fair    PT Frequency 2x / week    PT Duration 12 weeks    PT Treatment/Interventions ADLs/Self Care Home Management;Biofeedback;Cryotherapy;Moist Heat;Traction;Therapeutic activities;Therapeutic exercise;Neuromuscular re-education;Patient/family education;Manual techniques;Passive range of motion;Dry needling;Joint Manipulations;Spinal Manipulations    PT Next Visit Plan Patient is now discharged from PT as she does not appear to be responding appropriately and is in need of further medical evaluation.    PT Home Exercise Plan unable to produce positive response so never assigned    Recommended Other Services Return to physician for further  medical evaluation for serious pathology    Consulted and Agree with Plan of Care Patient           Patient will benefit from skilled therapeutic intervention in order to improve the following deficits and impairments:  Increased fascial restricitons, Pain, Increased muscle spasms, Decreased mobility, Decreased activity tolerance, Decreased endurance, Decreased range of motion, Decreased strength, Impaired perceived functional ability, Impaired UE functional use, Impaired flexibility  Visit Diagnosis: Cervicalgia  Radiculopathy, cervical region  Nonintractable headache, unspecified chronicity pattern, unspecified headache type  Paresthesia of skin     Problem List Patient Active Problem List   Diagnosis Date Noted  . Irritable bowel syndrome with constipation 06/07/2020  . Eczema 06/07/2020  . Bipolar 2 disorder ([place])   . Allergic rhinitis     Everlean Alstrom. Graylon Good, PT, DPT 07/16/20, 10:35  AM  [facility] [address] [place], [address] Phone: [phone]   Fax:  [phone]  Name: Jolanta Cabeza MRN: [id number] Date of Birth: [birth year]

## 2020-07-18 NOTE — Telephone Encounter (Signed)
Patient has tried and failed PT and has an abnormal x-ray.

## 2020-07-19 ENCOUNTER — Ambulatory Visit: Payer: Managed Care, Other (non HMO) | Admitting: Physical Therapy

## 2020-07-23 ENCOUNTER — Ambulatory Visit: Payer: Managed Care, Other (non HMO) | Admitting: Physical Therapy

## 2020-07-26 ENCOUNTER — Ambulatory Visit: Payer: Managed Care, Other (non HMO) | Admitting: Physical Therapy

## 2020-07-30 ENCOUNTER — Encounter: Payer: Managed Care, Other (non HMO) | Admitting: Physical Therapy

## 2020-08-02 ENCOUNTER — Encounter: Payer: Managed Care, Other (non HMO) | Admitting: Physical Therapy

## 2020-08-06 ENCOUNTER — Encounter: Payer: Managed Care, Other (non HMO) | Admitting: Physical Therapy

## 2020-08-09 ENCOUNTER — Encounter: Payer: Self-pay | Admitting: Physical Therapy

## 2020-08-09 DIAGNOSIS — M542 Cervicalgia: Secondary | ICD-10-CM

## 2020-08-09 DIAGNOSIS — M5412 Radiculopathy, cervical region: Secondary | ICD-10-CM

## 2020-08-09 DIAGNOSIS — R202 Paresthesia of skin: Secondary | ICD-10-CM

## 2020-08-09 DIAGNOSIS — R519 Headache, unspecified: Secondary | ICD-10-CM

## 2020-08-09 NOTE — Therapy (Signed)
[facility] [address], [place], [address] Phone: [phone]   Fax:  [phone]  Physical Therapy No-Visit Discharge Summary Reporting Period: 06/25/2020 - 08/09/2020  Patient Details  Name: Marilyn Francis MRN: [id number] Date of Birth: 13-Jun-1981 Referring Provider (PT): Valerie Roys, DO   Encounter Date: 08/09/2020    Past Medical History:  Diagnosis Date  . Allergic rhinitis   . Bipolar 2 disorder [facility])     Past Surgical History:  Procedure Laterality Date  . birth mark removal    . DENTAL SURGERY      There were no vitals filed for this visit.   Subjective Assessment - 08/09/20 1058    Subjective patient did not return to PT after being sent back to referring clinician due to concenrs about signs of UMN involvement and PT only  exacerbating symptoms.    Pertinent History Patient is a 39 y.o. female who presents to outpatient physical therapy with a referral for medical diagnosis chronic neck pain. This patient's chief complaints consist of B neck pain, R > L, with intermittent radiation and paresthesia to the R fingers and headaches leading to the following functional deficits: headaches can make her unable to do things, can struggle to lift things on the job, cannot support herself doing pull ups/while exercising, ROM can be an issue, sleeping, dressing, reaching overhead.Relevant past medical history and comorbidities include bipolar 2.  Patient denies hx of cancer, stroke, seizures, lung problem, major cardiac events, diabetes, unexplained weight loss, changes in bowel or bladder problems, new onset stumbling or dropping things    Limitations Lifting;House hold activities   headaches can make her unable to do things, can struggle to lift things on the job, cannot support herself doing pull ups/while exercising, ROM can be an issue, sleeping, dressing, reaching overhead.   Diagnostic tests neck x-ray  normal    Patient Stated Goals to stop having pain and weakness during functional activities           OBJECTIVE Patient is not present for examination at this time. Please see previous documentation for latest objective data.      PT Short Term Goals - 08/09/20 1059      PT SHORT TERM GOAL #1   Title Be independent with initial home exercise program for self-management of symptoms.    Baseline to be established visit 2 as appropriate (06/25/2020); never found exercise that did not make her worse    Time 3    Period Weeks    Status Not Met    Target Date 07/16/20             PT Long Term Goals - 08/09/20 1100      PT LONG TERM GOAL #1   Title Be independent with a long-term home exercise program for self-management of symptoms.    Baseline to be established session 2 (06/25/2120);    Time 12    Period Weeks    Status Not Met   TARGET DATE FOR ALL LONG TERM GOALS: 09/17/2020     PT LONG TERM GOAL #2   Title Demonstrate improved FOTO to equal or greater than 71 by visit #10 to demonstrate improvement in overall condition and self-reported functional ability.    Baseline 66 (06/25/2020);    Time 12    Period Weeks    Status Not Met      PT LONG TERM GOAL #3   Title Reduce pain and  paresthesia with functional activities to equal or less than 1/10 to allow patient to complete usual activities including ADLs, IADLs, and social engagement with less difficulty.    Baseline up to 7/10 (06/25/2020);    Time 12    Period Weeks    Status Not Met      PT LONG TERM GOAL #4   Title Patient will demonstrate B UE MMT 5/5 with same quality and without report of "feeling weak" on the right compared to left to allow her to complete her work and physical training with less difficulty.    Baseline feels weak and has weak quality on R vs L, reports limitations duirng training and work (06/25/2020);    Time 12    Period Weeks    Status Not Met      PT LONG TERM GOAL #5   Title Complete  community, work and/or recreational activities without limitation due to current condition.    Baseline headaches can make her unable to do things, can struggle to lift things on the job, cannot support herself doing pull ups/while exercising, ROM can be an issue, sleeping, dressing, reaching overhead (06/25/2020);    Time 12    Period Weeks    Status Not Met             Plan - 08/09/20 1101    Clinical Impression Statement Patient attended 6 physical therapy sessions this episode of care and was sent back to referring clinician for further medical work up due to signs of upper motor neuron involvement and continued exacerbation of condition by attempts to participate in physical therapy. Patient is now discharged from PT for these reasons.    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   Stability/Clinical Decision Making Evolving/Moderate complexity    Rehab Potential Fair    PT Frequency 2x / week    PT Duration 12 weeks    PT Treatment/Interventions ADLs/Self Care Home Management;Biofeedback;Cryotherapy;Moist Heat;Traction;Therapeutic activities;Therapeutic exercise;Neuromuscular re-education;Patient/family education;Manual techniques;Passive range of motion;Dry needling;Joint Manipulations;Spinal Manipulations    PT Next Visit Plan Patient is now discharged from PT    PT Home Exercise Plan unable to produce positive response so never assigned     Consulted and Agree with Plan of Care Patient           Patient will benefit from skilled therapeutic intervention in order to improve the following deficits and impairments:  Increased fascial restricitons, Pain, Increased muscle spasms, Decreased mobility, Decreased activity tolerance, Decreased endurance, Decreased range of motion, Decreased strength, Impaired perceived functional ability, Impaired UE functional use, Impaired flexibility  Visit Diagnosis: Cervicalgia  Radiculopathy, cervical region  Nonintractable headache, unspecified chronicity pattern, unspecified headache type  Paresthesia of skin     Problem List Patient Active Problem List   Diagnosis Date Noted  . Irritable bowel syndrome with constipation 06/07/2020  . Eczema 06/07/2020  . Bipolar 2 disorder ([place])   . Allergic rhinitis     Everlean Alstrom. Graylon Good, PT, DPT 08/09/20, 11:02 AM  [facility] [address] [place], [address] Phone: [phone]  Fax:  [phone]  Name: Marilyn Francis MRN: [id number] Date of Birth: Aug 28, 1981

## 2020-09-04 ENCOUNTER — Telehealth: Payer: Self-pay | Admitting: Psychiatry

## 2020-10-18 ENCOUNTER — Encounter: Payer: Self-pay | Admitting: Family Medicine

## 2020-10-18 ENCOUNTER — Telehealth (INDEPENDENT_AMBULATORY_CARE_PROVIDER_SITE_OTHER): Payer: Managed Care, Other (non HMO) | Admitting: Family Medicine

## 2020-10-18 VITALS — Wt 162.0 lb

## 2020-10-18 DIAGNOSIS — R053 Chronic cough: Secondary | ICD-10-CM

## 2020-10-18 MED ORDER — ALBUTEROL SULFATE HFA 108 (90 BASE) MCG/ACT IN AERS
2.0000 | INHALATION_SPRAY | Freq: Four times a day (QID) | RESPIRATORY_TRACT | 2 refills | Status: DC | PRN
Start: 2020-10-18 — End: 2021-06-11

## 2020-10-18 MED ORDER — MONTELUKAST SODIUM 10 MG PO TABS: 10.0000 mg | ORAL_TABLET | Freq: Every day | ORAL | 3 refills | Status: DC

## 2020-10-18 NOTE — Progress Notes (Signed)
Wt 162 lb (73.5 kg)   BMI 28.70 kg/m    Subjective:    Patient ID: Marilyn Francis, female    DOB: 1981-05-09, 39 y.o.   MRN: [id number]  HPI: Marilyn Francis is a 39 y.o. female  Chief Complaint  Patient presents with  . Cough    Started  in September   COUGH Duration: about 2-3 months Circumstances of initial development of cough: URI Cough severity: sometimes has coughing fits, sometimes just a little tickle Cough description: non-productive and dry Aggravating factors:  nothing Alleviating factors: nothing Status:  worse Treatments attempted: netipot, cough drops, tea, cough syrup, steaming  Wheezing: no Shortness of breath: yes- no changes Chest pain: no Chest tightness:no Nasal congestion: no Runny nose: yes Postnasal drip: yes Frequent throat clearing or swallowing: no Hemoptysis: no Fevers: no Night sweats: no Weight loss: no Heartburn: no Recent foreign travel: no Tuberculosis contacts: no  Relevant past medical, surgical, family and social history reviewed and updated as indicated. Interim medical history since our last visit reviewed. Allergies and medications reviewed and updated.  Review of Systems  HENT: Negative.   Respiratory: Positive for cough. Negative for apnea, choking, chest tightness, shortness of breath, wheezing and stridor.   Cardiovascular: Negative.   Gastrointestinal: Negative.   Musculoskeletal: Negative.   Psychiatric/Behavioral: Negative.     Per HPI unless specifically indicated above     Objective:    Wt 162 lb (73.5 kg)   BMI 28.70 kg/m   Wt Readings from Last 3 Encounters:  10/18/20 162 lb (73.5 kg)  06/07/20 168 lb 3.2 oz (76.3 kg)  02/05/18 174 lb 12.8 oz (79.3 kg)    Physical Exam Vitals and nursing note reviewed.  Pulmonary:     Effort: Pulmonary effort is normal. No respiratory distress.     Comments: Speaking in full sentences Neurological:     Mental Status: She is alert.  Psychiatric:        Mood  and Affect: Mood normal.        Behavior: Behavior normal.        Thought Content: Thought content normal.        Judgment: Judgment normal.     Results for orders placed or performed in visit on 06/07/20  GC/Chlamydia Probe Amp   Specimen: Urine   UR  Result Value Ref Range   Chlamydia trachomatis, NAA Negative Negative   Neisseria Gonorrhoeae by PCR Negative Negative  HIV Antibody (routine testing w rflx)  Result Value Ref Range   HIV Screen 4th Generation wRfx Non Reactive Non Reactive  CBC with Differential/Platelet  Result Value Ref Range   WBC 4.3 3.4 - 10.8 x10E3/uL   RBC 4.16 3.77 - 5.28 x10E6/uL   Hemoglobin 13.5 11.1 - 15.9 g/dL   Hematocrit 40.0 34.0 - 46.6 %   MCV 96 79 - 97 fL   MCH 32.5 26.6 - 33.0 pg   MCHC 33.8 31 - 35 g/dL   RDW 12.2 11.7 - 15.4 %   Platelets 202 150 - 450 x10E3/uL   Neutrophils 67 Not Estab. %   Lymphs 20 Not Estab. %   Monocytes 8 Not Estab. %   Eos 4 Not Estab. %   Basos 1 Not Estab. %   Neutrophils Absolute 2.9 1.40 - 7.00 x10E3/uL   Lymphocytes Absolute 0.9 0 - 3 x10E3/uL   Monocytes Absolute 0.3 0 - 0 x10E3/uL   EOS (ABSOLUTE) 0.2 0.0 - 0.4 x10E3/uL   Basophils Absolute 0.0 0 -  0 x10E3/uL   Immature Granulocytes 0 Not Estab. %   Immature Grans (Abs) 0.0 0.0 - 0.1 x10E3/uL  Comprehensive metabolic panel  Result Value Ref Range   Glucose 82 65 - 99 mg/dL   BUN 12 6 - 24 mg/dL   Creatinine, Ser 0.99 0.57 - 1.00 mg/dL   GFR calc non Af Amer 72 >59 mL/min/1.73   GFR calc Af Amer 82 >59 mL/min/1.73   BUN/Creatinine Ratio 12 9 - 23   Sodium 137 134 - 144 mmol/L   Potassium 4.1 3.5 - 5.2 mmol/L   Chloride 100 96 - 106 mmol/L   CO2 24 20 - 29 mmol/L   Calcium 9.3 8.7 - 10.2 mg/dL   Total Protein 6.5 6.0 - 8.5 g/dL   Albumin 4.3 3.8 - 4.8 g/dL   Globulin, Total 2.2 1.5 - 4.5 g/dL   Albumin/Globulin Ratio 2.0 1.2 - 2.2   Bilirubin Total 0.5 0.0 - 1.2 mg/dL   Alkaline Phosphatase 66 48 - 121 IU/L   AST 17 0 - 40 IU/L   ALT 6 0 -  32 IU/L  Lipid Panel w/o Chol/HDL Ratio  Result Value Ref Range   Cholesterol, Total 150 100 - 199 mg/dL   Triglycerides 38 0 - 149 mg/dL   HDL 88 >39 mg/dL   VLDL Cholesterol Cal 9 5 - 40 mg/dL   LDL Chol Calc (NIH) 53 0 - 99 mg/dL  TSH  Result Value Ref Range   TSH 2.350 0.450 - 4.500 uIU/mL  Urinalysis, Routine w reflex microscopic  Result Value Ref Range   Specific Gravity, UA 1.015 1.005 - 1.030   pH, UA 7.0 5.0 - 7.5   Color, UA Yellow Yellow   Appearance Ur Clear Clear   Leukocytes,UA Negative Negative   Protein,UA Negative Negative/Trace   Glucose, UA Negative Negative   Ketones, UA Negative Negative   RBC, UA Negative Negative   Bilirubin, UA Negative Negative   Urobilinogen, Ur 0.2 0.2 - 1.0 mg/dL   Nitrite, UA Negative Negative  VITAMIN D 25 Hydroxy (Vit-D Deficiency, Fractures)  Result Value Ref Range   Vit D, 25-Hydroxy 34.9 30.0 - 100.0 ng/mL  RPR  Result Value Ref Range   RPR Ser Ql Non Reactive Non Reactive  Hepatitis, Acute  Result Value Ref Range   Hep A IgM Negative Negative   Hepatitis B Surface Ag Negative Negative   Hep B C IgM Negative Negative   Hep C Virus Ab <0.1 0.0 - 0.9 s/co ratio  HSV(herpes simplex vrs) 1+2 ab-IgG  Result Value Ref Range   HSV 1 Glycoprotein G Ab, IgG <0.91 0.00 - 0.90 index   HSV 2 IgG, Type Spec <0.91 0.00 - 0.90 index      Assessment & Plan:   Problem List Items Addressed This Visit    None    Visit Diagnoses    Chronic cough    -  Primary   Will treat with singulair and albuterol. Recheck 2 weeks with spiro if not better. Continue to monitor. Call with any concerns.        Follow up plan: Return 2 weeks with spiro.   . This visit was completed via telephone due to the restrictions of the COVID-19 pandemic. All issues as above were discussed and addressed but no physical exam was performed. If it was felt that the patient should be evaluated in the office, they were directed there. The patient verbally  consented to this visit. Patient was unable  to complete an audio/visual visit due to Technical difficulties. Due to the catastrophic nature of the COVID-19 pandemic, this visit was done through audio contact only. . Location of the patient: home . Location of the provider: work . Those involved with this call:  . Provider: Park Liter, DO . CMA: Frazier Butt, [place] . Front Desk/Registration: Jill Side  . Time spent on call: 21 minutes on the phone discussing health concerns. 23 minutes total spent in review of patient's record and preparation of their chart.

## 2020-11-05 ENCOUNTER — Other Ambulatory Visit: Payer: Self-pay

## 2020-11-05 ENCOUNTER — Ambulatory Visit (INDEPENDENT_AMBULATORY_CARE_PROVIDER_SITE_OTHER): Payer: Managed Care, Other (non HMO) | Admitting: Family Medicine

## 2020-11-05 ENCOUNTER — Encounter: Payer: Self-pay | Admitting: Family Medicine

## 2020-11-05 VITALS — BP 121/73 | HR 75 | Temp 98.6°F | Ht 63.54 in | Wt 170.8 lb

## 2020-11-05 DIAGNOSIS — M542 Cervicalgia: Secondary | ICD-10-CM | POA: Diagnosis not present

## 2020-11-05 DIAGNOSIS — G8929 Other chronic pain: Secondary | ICD-10-CM | POA: Diagnosis not present

## 2020-11-05 DIAGNOSIS — R053 Chronic cough: Secondary | ICD-10-CM

## 2020-11-05 MED ORDER — OMEPRAZOLE 40 MG PO CPDR: 40.0000 mg | DELAYED_RELEASE_CAPSULE | Freq: Every day | ORAL | 3 refills | Status: DC

## 2020-11-05 NOTE — Assessment & Plan Note (Signed)
No better with singulair and albuterol. Spiro normal. Concern for LPR. Will start omeprazole. If not better, will get her into pulmonology for further evaluation. Continue to monitor.

## 2020-11-05 NOTE — Progress Notes (Signed)
BP 121/73   Pulse 75   Temp 98.6 F (37 C) (Oral)   Ht 5' 3.54" (1.614 m)   Wt 170 lb 12.8 oz (77.5 kg)   LMP 10/22/2020   SpO2 100%   BMI 29.74 kg/m    Subjective:    Patient ID: Marilyn Francis, female    DOB: [birth year], 39 y.o.   MRN: [id number]  HPI: Marilyn Francis is a 39 y.o. female  Chief Complaint  Patient presents with  . Follow-up    cough   COUGH Duration: 2.5-3.5 months Circumstances of initial development of cough: URI Cough severity: varies, sometime bad, sometimes mild Cough description: non-productive and dry Aggravating factors:  nothing Alleviating factors: nothing Status:  stable Treatments attempted: netipot, cough drops, tea, cough syrup, steaming, albuterol, zyrtec, singulair  Wheezing: no Shortness of breath: yes Chest pain: no Chest tightness:no Nasal congestion: no Runny nose: yes Postnasal drip: yes Frequent throat clearing or swallowing: no Hemoptysis: no Fevers: no Night sweats: no Weight loss: no Heartburn: no Recent foreign travel: no Tuberculosis contacts: no  NECK PAIN FOLLOW UP Duration: about 6 months Status: no better Treatments attempted:chiropractry, massage, rest, ice, heat, APAP, ibuprofen and aleve, PT    Compliant with recommended treatment: yes Relief with NSAIDs?:  mild Location:R sided Duration:chronic Severity: moderate Quality: aching, sore and tight Frequency: constant Radiation: headache and into R arm Aggravating factors: lifting and movement Alleviating factors: nothing Weakness:  no Paresthesias / decreased sensation:  no  Fevers:  no  Relevant past medical, surgical, family and social history reviewed and updated as indicated. Interim medical history since our last visit reviewed. Allergies and medications reviewed and updated.  Review of Systems  Constitutional: Negative.   Respiratory: Negative.   Cardiovascular: Negative.   Gastrointestinal: Negative.   Musculoskeletal: Positive for  arthralgias, myalgias, neck pain and neck stiffness. Negative for back pain, gait problem and joint swelling.  Neurological: Negative.   Psychiatric/Behavioral: Negative.     Per HPI unless specifically indicated above     Objective:    BP 121/73   Pulse 75   Temp 98.6 F (37 C) (Oral)   Ht 5' 3.54" (1.614 m)   Wt 170 lb 12.8 oz (77.5 kg)   LMP 10/22/2020   SpO2 100%   BMI 29.74 kg/m   Wt Readings from Last 3 Encounters:  11/05/20 170 lb 12.8 oz (77.5 kg)  10/18/20 162 lb (73.5 kg)  06/07/20 168 lb 3.2 oz (76.3 kg)    Physical Exam Vitals and nursing note reviewed.  Constitutional:      General: She is not in acute distress.    Appearance: Normal appearance. She is not ill-appearing, toxic-appearing or diaphoretic.  HENT:     Head: Normocephalic and atraumatic.     Right Ear: External ear normal.     Left Ear: External ear normal.     Nose: Nose normal.     Mouth/Throat:     Mouth: Mucous membranes are moist.     Pharynx: Oropharynx is clear.  Eyes:     General: No scleral icterus.       Right eye: No discharge.        Left eye: No discharge.     Extraocular Movements: Extraocular movements intact.     Conjunctiva/sclera: Conjunctivae normal.     Pupils: Pupils are equal, round, and reactive to light.  Cardiovascular:     Rate and Rhythm: Normal rate and regular rhythm.     Pulses: Normal pulses.  Heart sounds: Normal heart sounds. No murmur heard.  No friction rub. No gallop.   Pulmonary:     Effort: Pulmonary effort is normal. No respiratory distress.     Breath sounds: Normal breath sounds. No stridor. No wheezing, rhonchi or rales.  Chest:     Chest wall: No tenderness.  Musculoskeletal:        General: Normal range of motion.     Cervical back: Normal range of motion and neck supple.  Skin:    General: Skin is warm and dry.     Capillary Refill: Capillary refill takes less than 2 seconds.     Coloration: Skin is not jaundiced or pale.      Findings: No bruising, erythema, lesion or rash.  Neurological:     General: No focal deficit present.     Mental Status: She is alert and oriented to person, place, and time. Mental status is at baseline.  Psychiatric:        Mood and Affect: Mood normal.        Behavior: Behavior normal.        Thought Content: Thought content normal.        Judgment: Judgment normal.     Results for orders placed or performed in visit on 06/07/20  GC/Chlamydia Probe Amp   Specimen: Urine   UR  Result Value Ref Range   Chlamydia trachomatis, NAA Negative Negative   Neisseria Gonorrhoeae by PCR Negative Negative  HIV Antibody (routine testing w rflx)  Result Value Ref Range   HIV Screen 4th Generation wRfx Non Reactive Non Reactive  CBC with Differential/Platelet  Result Value Ref Range   WBC 4.3 3.4 - 10.8 x10E3/uL   RBC 4.16 3.77 - 5.28 x10E6/uL   Hemoglobin 13.5 11.1 - 15.9 g/dL   Hematocrit 40.0 34.0 - 46.6 %   MCV 96 79 - 97 fL   MCH 32.5 26.6 - 33.0 pg   MCHC 33.8 31 - 35 g/dL   RDW 12.2 11.7 - 15.4 %   Platelets 202 150 - 450 x10E3/uL   Neutrophils 67 Not Estab. %   Lymphs 20 Not Estab. %   Monocytes 8 Not Estab. %   Eos 4 Not Estab. %   Basos 1 Not Estab. %   Neutrophils Absolute 2.9 1.40 - 7.00 x10E3/uL   Lymphocytes Absolute 0.9 0 - 3 x10E3/uL   Monocytes Absolute 0.3 0 - 0 x10E3/uL   EOS (ABSOLUTE) 0.2 0.0 - 0.4 x10E3/uL   Basophils Absolute 0.0 0 - 0 x10E3/uL   Immature Granulocytes 0 Not Estab. %   Immature Grans (Abs) 0.0 0.0 - 0.1 x10E3/uL  Comprehensive metabolic panel  Result Value Ref Range   Glucose 82 65 - 99 mg/dL   BUN 12 6 - 24 mg/dL   Creatinine, Ser 0.99 0.57 - 1.00 mg/dL   GFR calc non Af Amer 72 >59 mL/min/1.73   GFR calc Af Amer 82 >59 mL/min/1.73   BUN/Creatinine Ratio 12 9 - 23   Sodium 137 134 - 144 mmol/L   Potassium 4.1 3.5 - 5.2 mmol/L   Chloride 100 96 - 106 mmol/L   CO2 24 20 - 29 mmol/L   Calcium 9.3 8.7 - 10.2 mg/dL   Total Protein 6.5  6.0 - 8.5 g/dL   Albumin 4.3 3.8 - 4.8 g/dL   Globulin, Total 2.2 1.5 - 4.5 g/dL   Albumin/Globulin Ratio 2.0 1.2 - 2.2   Bilirubin Total 0.5 0.0 - 1.2 mg/dL  Alkaline Phosphatase 66 48 - 121 IU/L   AST 17 0 - 40 IU/L   ALT 6 0 - 32 IU/L  Lipid Panel w/o Chol/HDL Ratio  Result Value Ref Range   Cholesterol, Total 150 100 - 199 mg/dL   Triglycerides 38 0 - 149 mg/dL   HDL 88 >39 mg/dL   VLDL Cholesterol Cal 9 5 - 40 mg/dL   LDL Chol Calc (NIH) 53 0 - 99 mg/dL  TSH  Result Value Ref Range   TSH 2.350 0.450 - 4.500 uIU/mL  Urinalysis, Routine w reflex microscopic  Result Value Ref Range   Specific Gravity, UA 1.015 1.005 - 1.030   pH, UA 7.0 5.0 - 7.5   Color, UA Yellow Yellow   Appearance Ur Clear Clear   Leukocytes,UA Negative Negative   Protein,UA Negative Negative/Trace   Glucose, UA Negative Negative   Ketones, UA Negative Negative   RBC, UA Negative Negative   Bilirubin, UA Negative Negative   Urobilinogen, Ur 0.2 0.2 - 1.0 mg/dL   Nitrite, UA Negative Negative  VITAMIN D 25 Hydroxy (Vit-D Deficiency, Fractures)  Result Value Ref Range   Vit D, 25-Hydroxy 34.9 30.0 - 100.0 ng/mL  RPR  Result Value Ref Range   RPR Ser Ql Non Reactive Non Reactive  Hepatitis, Acute  Result Value Ref Range   Hep A IgM Negative Negative   Hepatitis B Surface Ag Negative Negative   Hep B C IgM Negative Negative   Hep C Virus Ab <0.1 0.0 - 0.9 s/co ratio  HSV(herpes simplex vrs) 1+2 ab-IgG  Result Value Ref Range   HSV 1 Glycoprotein G Ab, IgG <0.91 0.00 - 0.90 index   HSV 2 IgG, Type Spec <0.91 0.00 - 0.90 index      Assessment & Plan:   Problem List Items Addressed This Visit      Other   Chronic cough - Primary    No better with singulair and albuterol. Spiro normal. Concern for LPR. Will start omeprazole. If not better, will get her into pulmonology for further evaluation. Continue to monitor.       Relevant Orders   Spirometry with graph (Completed)    Other Visit  Diagnoses    Chronic neck pain       No better. Continues with pain. Failed PT. Normal x-ray. Will get MRI. Await results.   Relevant Orders   MR Cervical Spine Wo Contrast       Follow up plan: Return in about 4 weeks (around 12/03/2020).

## 2020-11-13 ENCOUNTER — Ambulatory Visit: Payer: Managed Care, Other (non HMO)

## 2020-11-20 ENCOUNTER — Ambulatory Visit
Admission: RE | Admit: 2020-11-20 | Discharge: 2020-11-20 | Disposition: A | Discharge status: Home / Self Care | Payer: Managed Care, Other (non HMO) | Source: Ambulatory Visit | Attending: Family Medicine | Admitting: Family Medicine

## 2020-11-20 ENCOUNTER — Other Ambulatory Visit: Payer: Self-pay

## 2020-11-20 DIAGNOSIS — M542 Cervicalgia: Secondary | ICD-10-CM | POA: Insufficient documentation

## 2020-11-20 DIAGNOSIS — G8929 Other chronic pain: Secondary | ICD-10-CM | POA: Diagnosis present

## 2020-11-20 IMAGING — MR MR CERVICAL SPINE W/O CM
5 series · 42 of 48 positions shown · non-contrast
Comparison: Cervical spine radiograph [DATE]

CLINICAL DATA: Cervical radiculopathy. Right shoulder and arm pain
and numbness.

EXAM:
MRI CERVICAL SPINE WITHOUT CONTRAST
TECHNIQUE: Multiplanar, multisequence MR imaging of the cervical spine was
performed. No intravenous contrast was administered.

[Series 5: T2 · sagittal · 3.0mm · 0.62mm/px · 6 of 15 slices shown (1 of 2)]
[im 1/15]
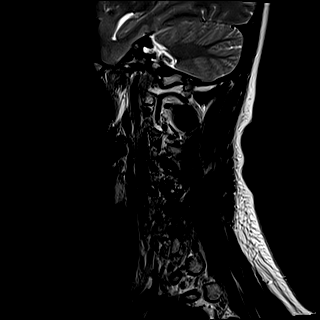
[im 3/15]
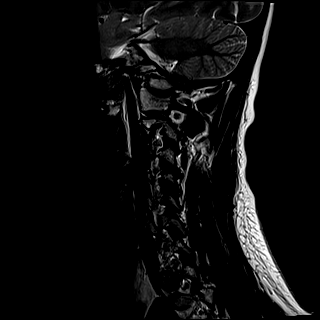
[im 6/15]
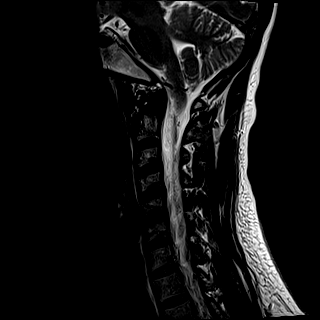
[im 9/15]
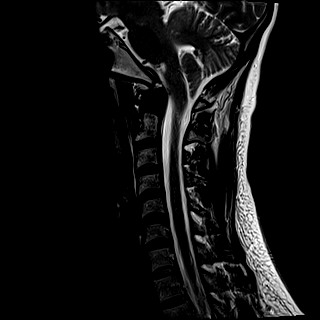
[im 12/15]
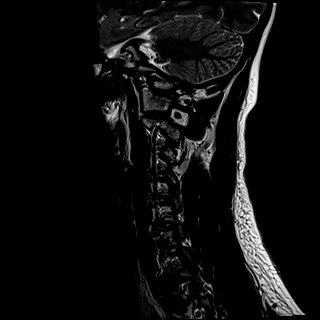
[im 15/15]
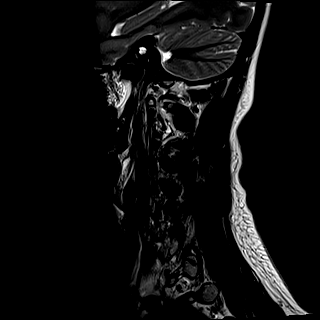

[Series 6: FLAIR · sagittal · 3.0mm · 0.78mm/px · 7 of 15 slices shown]
[im 1/15]
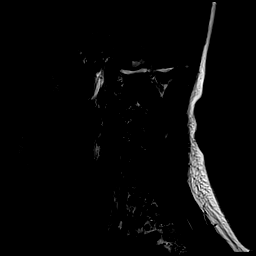
[im 3/15]
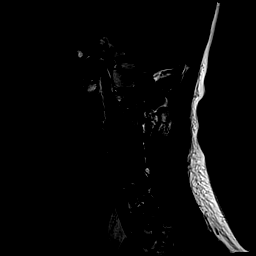
[im 5/15]
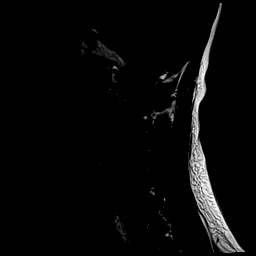
[im 8/15]
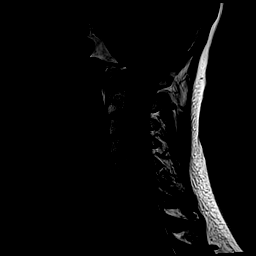
[im 10/15]
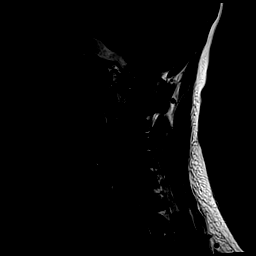
[im 12/15]
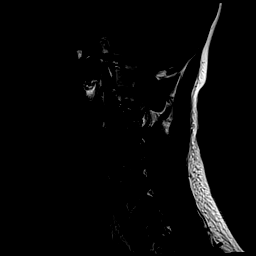
[im 15/15]
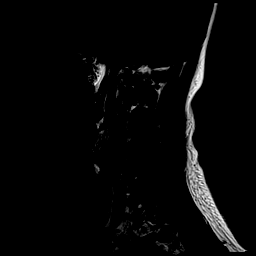

[Series 7: STIR · sagittal · 3.0mm · 0.62mm/px · 7 of 15 slices shown]
[im 1/15]
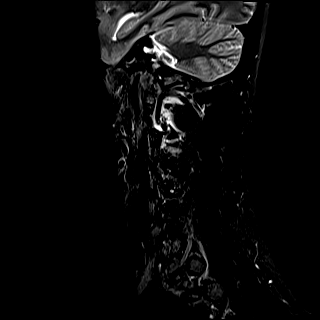
[im 3/15]
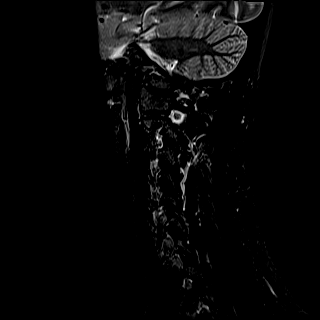
[im 5/15]
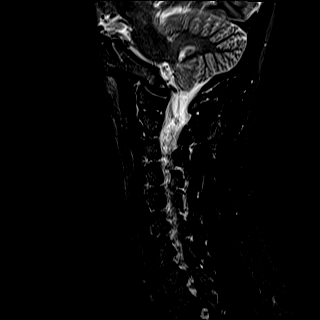
[im 8/15]
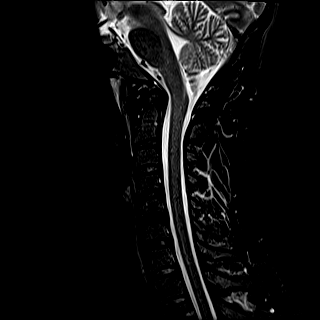
[im 10/15]
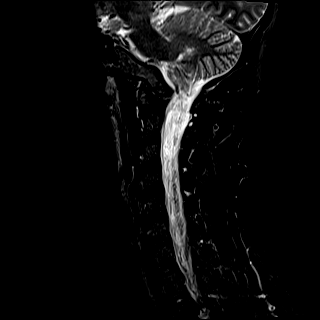
[im 12/15]
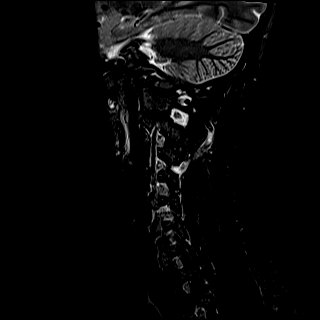
[im 15/15]
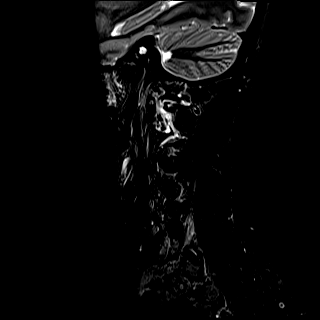

[Series 8: T2 · axial · 3.0mm · 0.70mm/px · z∈[-123,-26]mm · 14 of 29 slices shown (2 of 2)]
[im 1/29]
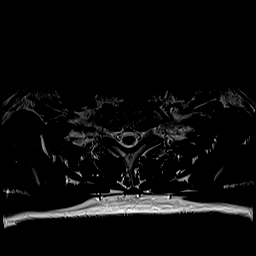
[im 3/29]
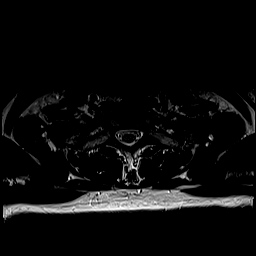
[im 5/29]
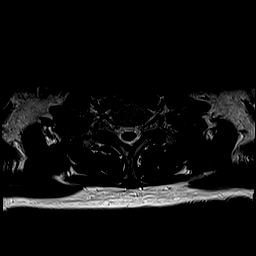
[im 7/29]
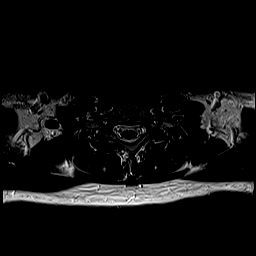
[im 9/29]
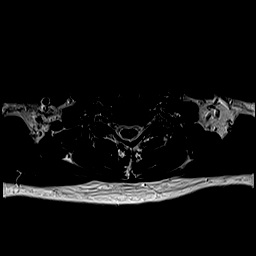
[im 11/29]
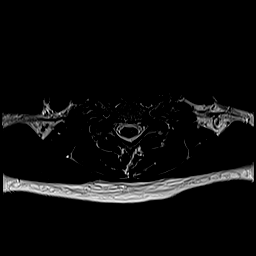
[im 13/29]
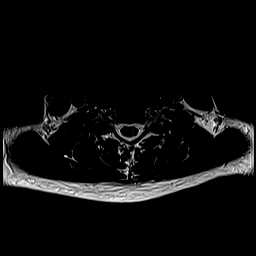
[im 16/29]
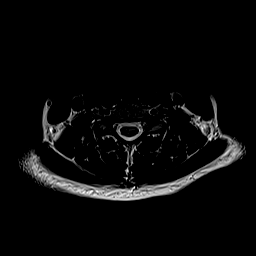
[im 18/29]
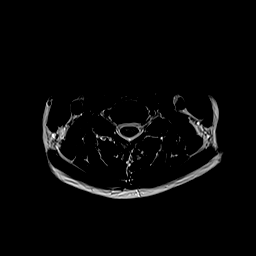
[im 20/29]
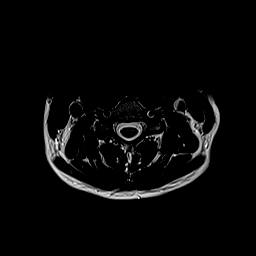
[im 22/29]
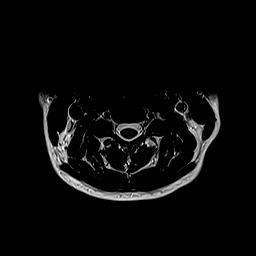
[im 24/29]
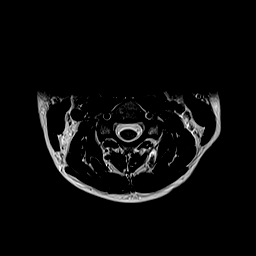
[im 26/29]
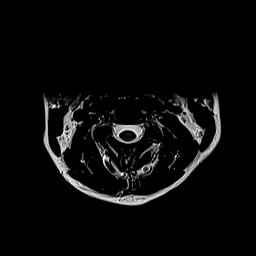
[im 29/29]
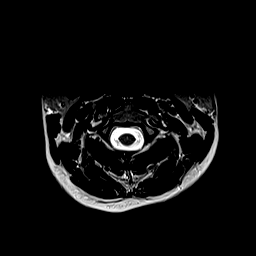

[Series 9: ax mpgr · axial · 3.0mm · 0.35mm/px · z∈[-123,-26]mm · 8 of 29 slices shown]
[im 1/29]
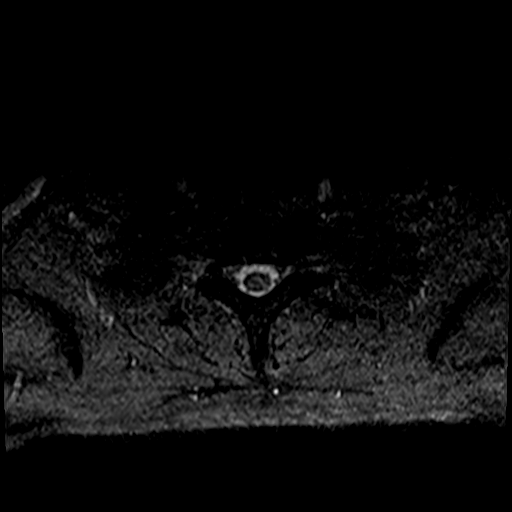
[im 5/29]
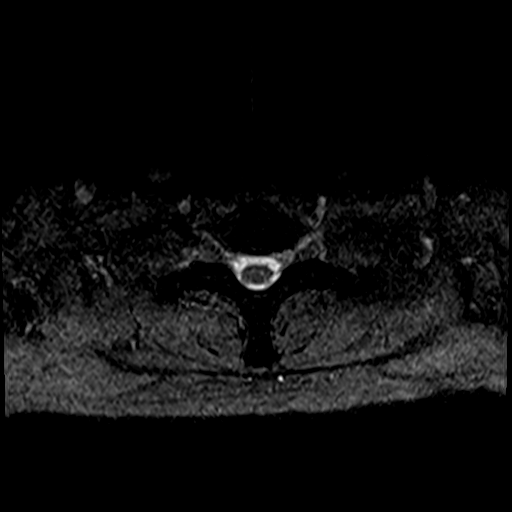
[im 9/29]
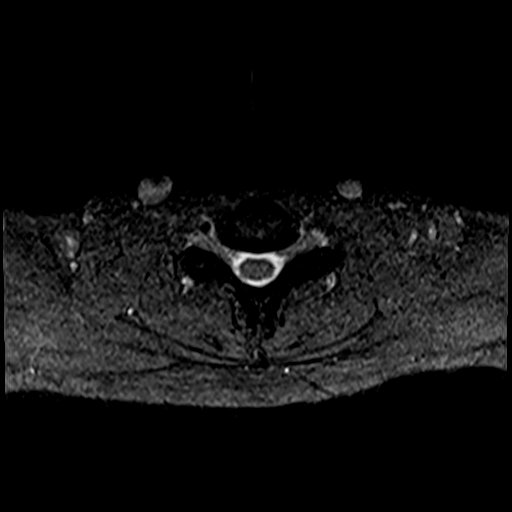
[im 13/29]
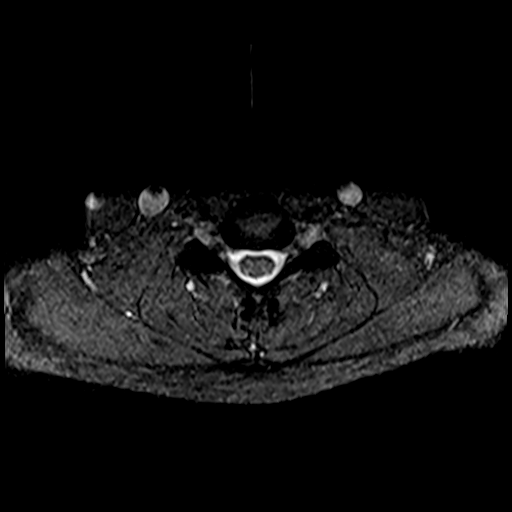
[im 16/29]
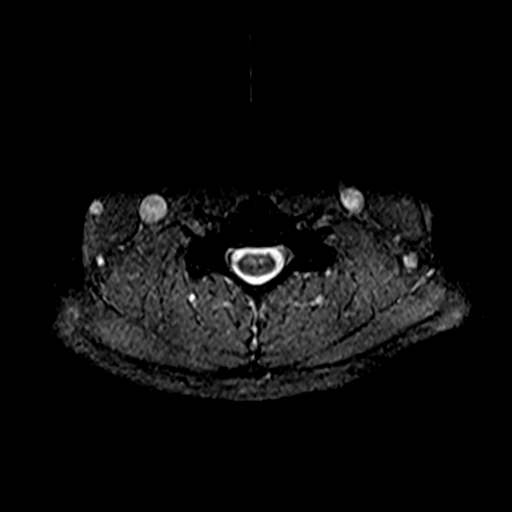
[im 20/29]
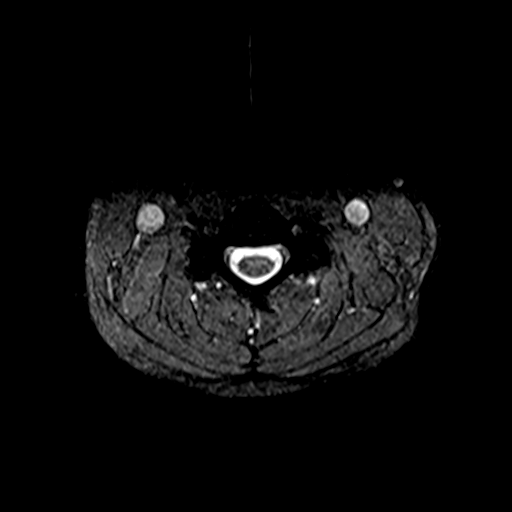
[im 24/29]
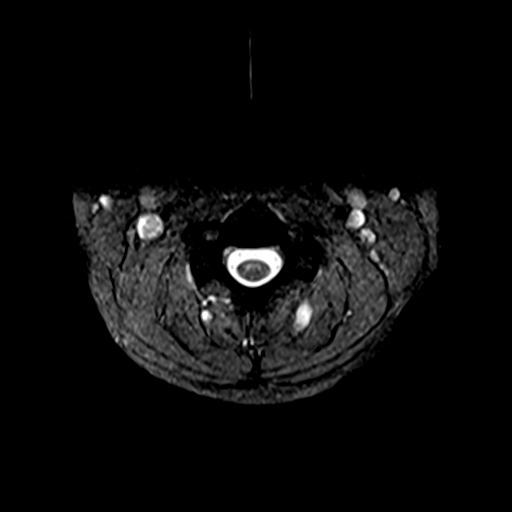
[im 29/29]
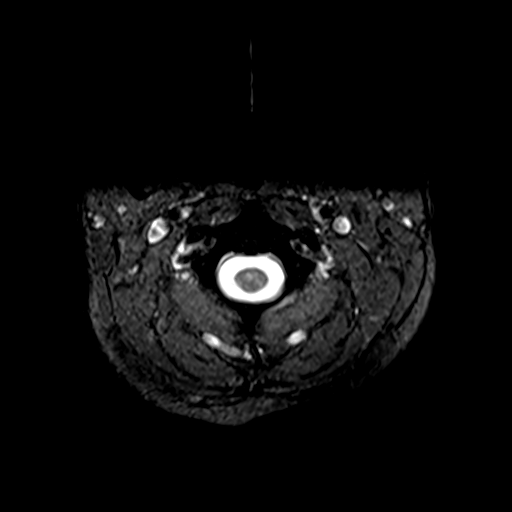

[42 of 48 positions shown; findings below may reference images not displayed]

FINDINGS: Alignment: Normal

Vertebrae: Normal bone marrow.  Negative for fracture or mass.

Cord: Normal signal and morphology.

Posterior Fossa, vertebral arteries, paraspinal tissues: Negative

Disc levels:

Normal disc spaces. No disc degeneration or disc protrusion.
Negative for spinal or foraminal stenosis.
IMPRESSION: Normal MRI cervical spine.

## 2020-11-23 ENCOUNTER — Ambulatory Visit: Payer: Managed Care, Other (non HMO) | Admitting: Physician Assistant

## 2020-11-23 ENCOUNTER — Other Ambulatory Visit: Payer: Self-pay

## 2020-11-23 DIAGNOSIS — Z113 Encounter for screening for infections with a predominantly sexual mode of transmission: Secondary | ICD-10-CM

## 2020-11-23 LAB — WET PREP FOR TRICH, YEAST, CLUE
Trichomonas Exam: NEGATIVE
Yeast Exam: NEGATIVE

## 2020-11-24 ENCOUNTER — Encounter: Payer: Self-pay | Admitting: Physician Assistant

## 2020-11-24 NOTE — Progress Notes (Signed)
[facility] Department STI clinic/screening visit  Subjective:  Marilyn Francis is a 39 y.o. female being seen today for an STI screening visit. The patient reports they do not have symptoms.  Patient reports that they do not desire a pregnancy in the next year.   They reported they are not interested in discussing contraception today.  Patient's last menstrual period was 11/16/2020.   Patient has the following medical conditions:   Patient Active Problem List   Diagnosis Date Noted  . Chronic cough 11/05/2020  . Irritable bowel syndrome with constipation 06/07/2020  . Eczema 06/07/2020  . Bipolar 2 disorder ([place])   . Allergic rhinitis     Chief Complaint  Patient presents with  . SEXUALLY TRANSMITTED DISEASE    screening    HPI  Patient reports that she is not having any symptoms but would like a screening today.  Reports that she has reflux and allergies for which she takes medicines regularly.  States last HIV test was in 06/2020 and last pap was in 2019.  Reports that she uses condoms and dental dams as her BCM.    See flowsheet for further details and programmatic requirements.    The following portions of the patient's history were reviewed and updated as appropriate: allergies, current medications, past medical history, past social history, past surgical history and problem list.  Objective:  There were no vitals filed for this visit.  Physical Exam Constitutional:      General: She is not in acute distress.    Appearance: Normal appearance.  HENT:     Head: Normocephalic and atraumatic.     Comments: No nits,lice, or hair loss. No cervical, supraclavicular or axillary adenopathy.    Mouth/Throat:     Mouth: Mucous membranes are moist.     Pharynx: Oropharynx is clear. No oropharyngeal exudate or posterior oropharyngeal erythema.  Eyes:     Conjunctiva/sclera: Conjunctivae normal.  Pulmonary:     Effort: Pulmonary effort is normal.  Abdominal:      Palpations: Abdomen is soft. There is no mass.     Tenderness: There is no abdominal tenderness. There is no guarding or rebound.  Genitourinary:    General: Normal vulva.     Rectum: Normal.     Comments: External genitalia/pubic area without nits, lice, edema, erythema, lesions and inguinal adenopathy. Vagina with normal mucosa and discharge. Cervix without visible lesions. Uterus firm, mobile, nt, no masses, no CMT, no adnexal tenderness or fullness. Musculoskeletal:     Cervical back: Neck supple. No tenderness.  Skin:    General: Skin is warm and dry.     Findings: No bruising, erythema, lesion or rash.  Neurological:     Mental Status: She is alert and oriented to person, place, and time.  Psychiatric:        Mood and Affect: Mood normal.        Behavior: Behavior normal.        Thought Content: Thought content normal.        Judgment: Judgment normal.      Assessment and Plan:  Marilyn Francis is a 39 y.o. female presenting to the [facility] Department for STI screening  1. Screening for STD (sexually transmitted disease) Patient into clinic without symptoms. Reviewed with patient that wet mount is normal and no treatment is indicated today. Rec condoms with all sex. Await test results.  Counseled that RN will call if needs to RTC for treatment once results are back. -  WET PREP FOR TRICH, YEAST, CLUE - Gonococcus culture - Chlamydia/Gonorrhea [place] [facility] - HBV Antigen/Antibody [facility] - HIV/HCV [place] [facility] - Syphilis Serology, [facility]     No follow-ups on file.  Future Appointments  Date Time Provider [place]  12/05/2020  9:00 AM Valerie Roys, DO [facility] [place], [place]

## 2020-11-27 LAB — GONOCOCCUS CULTURE

## 2020-11-28 LAB — HM HEPATITIS C SCREENING LAB: HM Hepatitis Screen: NEGATIVE

## 2020-11-28 LAB — HM HIV SCREENING LAB: HM HIV Screening: NEGATIVE

## 2020-12-04 LAB — HEPATITIS B SURFACE ANTIGEN

## 2020-12-05 ENCOUNTER — Other Ambulatory Visit: Payer: Self-pay

## 2020-12-05 ENCOUNTER — Encounter: Payer: Self-pay | Admitting: Family Medicine

## 2020-12-05 ENCOUNTER — Ambulatory Visit: Payer: Managed Care, Other (non HMO) | Admitting: Family Medicine

## 2020-12-05 VITALS — BP 122/82 | HR 65 | Temp 98.5°F | Wt 173.4 lb

## 2020-12-05 DIAGNOSIS — M542 Cervicalgia: Secondary | ICD-10-CM | POA: Diagnosis not present

## 2020-12-05 DIAGNOSIS — R053 Chronic cough: Secondary | ICD-10-CM

## 2020-12-05 DIAGNOSIS — G8929 Other chronic pain: Secondary | ICD-10-CM | POA: Diagnosis not present

## 2020-12-05 NOTE — Progress Notes (Signed)
BP 122/82   Pulse 65   Temp 98.5 F (36.9 C)   Wt 173 lb 6.4 oz (78.7 kg)   LMP 11/16/2020   SpO2 99%   BMI 30.19 kg/m    Subjective:    Patient ID: Marilyn Francis, female    DOB: 02-26-1981, 39 y.o.   MRN: [id number]  HPI: Marilyn Francis is a 39 y.o. female  Chief Complaint  Patient presents with  . Cough    Pt here to follow up on chronic cough states cough is better but not completely gone  . Neck Pain    Follow up on neck pain pt states pain is still there    Cough is much better, doesn't come up quite as often. She feels like the omeprazole is helping a lot. She feels like the left over cough she feels like is coming from her neck issues.   NECK PAIN FOLLOW UP Status: stable Treatments attempted: rest, ice, heat, APAP, ibuprofen, aleve, muscle relaxer, physical therapy and HEP  Compliant with recommended treatment: average Relief with NSAIDs?:  mild Location:Right Duration:chronic Severity: moderate Quality: aching, sore and tight Frequency: constant Radiation: headache Aggravating factors: lifting and movement Alleviating factors: nothing Weakness:  yes Paresthesias / decreased sensation:  yes  Fevers:  no  Relevant past medical, surgical, family and social history reviewed and updated as indicated. Interim medical history since our last visit reviewed. Allergies and medications reviewed and updated.  Review of Systems  Constitutional: Negative.   HENT: Negative.   Respiratory: Positive for cough. Negative for apnea, choking, chest tightness, shortness of breath, wheezing and stridor.   Cardiovascular: Negative.   Gastrointestinal: Negative.   Musculoskeletal: Positive for myalgias, neck pain and neck stiffness. Negative for arthralgias, back pain, gait problem and joint swelling.  Neurological: Negative.   Psychiatric/Behavioral: Negative.     Per HPI unless specifically indicated above     Objective:    BP 122/82   Pulse 65   Temp 98.5 F  (36.9 C)   Wt 173 lb 6.4 oz (78.7 kg)   LMP 11/16/2020   SpO2 99%   BMI 30.19 kg/m   Wt Readings from Last 3 Encounters:  12/05/20 173 lb 6.4 oz (78.7 kg)  11/05/20 170 lb 12.8 oz (77.5 kg)  10/18/20 162 lb (73.5 kg)    Physical Exam Vitals and nursing note reviewed.  Constitutional:      General: She is not in acute distress.    Appearance: Normal appearance. She is not ill-appearing, toxic-appearing or diaphoretic.  HENT:     Head: Normocephalic and atraumatic.     Right Ear: External ear normal.     Left Ear: External ear normal.     Nose: Nose normal.     Mouth/Throat:     Mouth: Mucous membranes are moist.     Pharynx: Oropharynx is clear.  Eyes:     General: No scleral icterus.       Right eye: No discharge.        Left eye: No discharge.     Extraocular Movements: Extraocular movements intact.     Conjunctiva/sclera: Conjunctivae normal.     Pupils: Pupils are equal, round, and reactive to light.  Cardiovascular:     Rate and Rhythm: Normal rate and regular rhythm.     Pulses: Normal pulses.     Heart sounds: Normal heart sounds. No murmur heard. No friction rub. No gallop.   Pulmonary:     Effort: Pulmonary effort is  normal. No respiratory distress.     Breath sounds: Normal breath sounds. No stridor. No wheezing, rhonchi or rales.  Chest:     Chest wall: No tenderness.  Musculoskeletal:        General: Normal range of motion.     Cervical back: Normal range of motion and neck supple.  Skin:    General: Skin is warm and dry.     Capillary Refill: Capillary refill takes less than 2 seconds.     Coloration: Skin is not jaundiced or pale.     Findings: No bruising, erythema, lesion or rash.  Neurological:     General: No focal deficit present.     Mental Status: She is alert and oriented to person, place, and time. Mental status is at baseline.  Psychiatric:        Mood and Affect: Mood normal.        Behavior: Behavior normal.        Thought Content:  Thought content normal.        Judgment: Judgment normal.     Results for orders placed or performed in visit on 11/28/20  [facility] HIV SCREENING LAB  Result Value Ref Range   [facility] HIV Screening Negative - Validated   [facility] HEPATITIS C SCREENING LAB  Result Value Ref Range   [facility] Hepatitis Screen Negative-Validated       Assessment & Plan:   Problem List Items Addressed This Visit      Other   Chronic cough    Doing better, but still there. Offered referral to pulmonology or ENT. She feels like her neck pain is contributing to her cough. She would like to pursue this before seeing those specialists. Continue current regimen. Continue to monitor. Call with any concerns.       Chronic neck pain - Primary    Worse with PT. MRI normal. Still not feeling any better. Would like to see ortho. Referral generated today. Await their input.       Relevant Orders   Ambulatory referral to Orthopedic Surgery       Follow up plan: Return if symptoms worsen or fail to improve.

## 2020-12-13 ENCOUNTER — Encounter: Payer: Self-pay | Admitting: Family Medicine

## 2020-12-13 DIAGNOSIS — R053 Chronic cough: Secondary | ICD-10-CM

## 2020-12-17 ENCOUNTER — Encounter: Payer: Self-pay | Admitting: Family Medicine

## 2020-12-17 DIAGNOSIS — G8929 Other chronic pain: Secondary | ICD-10-CM | POA: Insufficient documentation

## 2020-12-17 DIAGNOSIS — M542 Cervicalgia: Secondary | ICD-10-CM | POA: Insufficient documentation

## 2020-12-17 NOTE — Assessment & Plan Note (Signed)
Doing better, but still there. Offered referral to pulmonology or ENT. She feels like her neck pain is contributing to her cough. She would like to pursue this before seeing those specialists. Continue current regimen. Continue to monitor. Call with any concerns.

## 2020-12-17 NOTE — Assessment & Plan Note (Signed)
Worse with PT. MRI normal. Still not feeling any better. Would like to see ortho. Referral generated today. Await their input.

## 2021-04-03 ENCOUNTER — Encounter: Payer: Self-pay | Admitting: Physician Assistant

## 2021-04-03 ENCOUNTER — Ambulatory Visit: Payer: Self-pay | Admitting: Physician Assistant

## 2021-04-03 ENCOUNTER — Other Ambulatory Visit: Payer: Self-pay

## 2021-04-03 DIAGNOSIS — Z113 Encounter for screening for infections with a predominantly sexual mode of transmission: Secondary | ICD-10-CM

## 2021-04-03 LAB — WET PREP FOR TRICH, YEAST, CLUE
Trichomonas Exam: NEGATIVE
Yeast Exam: NEGATIVE

## 2021-04-03 NOTE — Progress Notes (Signed)
Wet mount results reviewed with Provider. No further orders given. Windle Guard, RN

## 2021-04-03 NOTE — Progress Notes (Signed)
[facility] Department STI clinic/screening visit  Subjective:  Marilyn Francis is a 40 y.o. female being seen today for an STI screening visit. The patient reports they do not have symptoms.  Patient reports that they do not desire a pregnancy in the next year.   They reported they are not interested in discussing contraception today.  Patient's last menstrual period was 03/27/2021.   Patient has the following medical conditions:   Patient Active Problem List   Diagnosis Date Noted  . Chronic neck pain 12/17/2020  . Chronic cough 11/05/2020  . Irritable bowel syndrome with constipation 06/07/2020  . Eczema 06/07/2020  . Bipolar 2 disorder ([place])   . Allergic rhinitis     Chief Complaint  Patient presents with  . SEXUALLY TRANSMITTED DISEASE    Screening    HPI  Patient reports that she is not having any symptoms but would like to have a screening.  Denies chronic conditions.  States that she takes allergy medicines as needed.  Reports last HIV test was 11/2020 and last pap was 2018.     See flowsheet for further details and programmatic requirements.    The following portions of the patient's history were reviewed and updated as appropriate: allergies, current medications, past medical history, past social history, past surgical history and problem list.  Objective:  There were no vitals filed for this visit.  Physical Exam Constitutional:      General: She is not in acute distress.    Appearance: Normal appearance.  HENT:     Head: Normocephalic and atraumatic.     Comments: No nits,lice, or hair loss. No cervical, supraclavicular or axillary adenopathy.    Mouth/Throat:     Mouth: Mucous membranes are moist.     Pharynx: Oropharynx is clear. No oropharyngeal exudate or posterior oropharyngeal erythema.  Eyes:     Conjunctiva/sclera: Conjunctivae normal.  Pulmonary:     Effort: Pulmonary effort is normal.  Abdominal:     Palpations: Abdomen is soft.  There is no mass.     Tenderness: There is no abdominal tenderness. There is no guarding or rebound.  Genitourinary:    General: Normal vulva.     Rectum: Normal.     Comments: External genitalia/pubic area without nits, lice, edema, erythema, lesions and inguinal adenopathy. Vagina with normal mucosa and discharge. Cervix without visible lesions. Uterus firm, mobile, nt, no masses, no CMT, no adnexal tenderness or fullness. Musculoskeletal:     Cervical back: Neck supple. No tenderness.  Skin:    General: Skin is warm and dry.     Findings: No bruising, erythema, lesion or rash.  Neurological:     Mental Status: She is alert and oriented to person, place, and time.  Psychiatric:        Mood and Affect: Mood normal.        Behavior: Behavior normal.        Thought Content: Thought content normal.        Judgment: Judgment normal.      Assessment and Plan:  Marilyn Francis is a 40 y.o. female presenting to the [facility] Department for STI screening  1. Screening for STD (sexually transmitted disease) Patient into clinic without symptoms. Rec condoms with all sex. Await test results.  Counseled that RN will call if needs to RTC for treatment once results are back. - WET PREP FOR [place], YEAST, [place] [facility] -  Syphilis Serology, [facility]     No follow-ups on file.  No future appointments.  Jerene Dilling, PA

## 2021-04-07 LAB — GONOCOCCUS CULTURE

## 2021-04-08 LAB — HM HIV SCREENING LAB: HM HIV Screening: NEGATIVE

## 2021-06-11 ENCOUNTER — Other Ambulatory Visit: Payer: Self-pay

## 2021-06-11 ENCOUNTER — Ambulatory Visit
Admission: RE | Admit: 2021-06-11 | Discharge: 2021-06-11 | Disposition: A | Discharge status: Home / Self Care | Payer: 59 | Source: Ambulatory Visit | Attending: Family Medicine | Admitting: Family Medicine

## 2021-06-11 ENCOUNTER — Other Ambulatory Visit: Payer: Self-pay | Admitting: Family Medicine

## 2021-06-11 ENCOUNTER — Encounter: Payer: Self-pay | Admitting: Family Medicine

## 2021-06-11 ENCOUNTER — Ambulatory Visit (INDEPENDENT_AMBULATORY_CARE_PROVIDER_SITE_OTHER): Payer: 59 | Admitting: Family Medicine

## 2021-06-11 VITALS — BP 136/81 | HR 68 | Temp 98.4°F | Wt 178.8 lb

## 2021-06-11 DIAGNOSIS — R1030 Lower abdominal pain, unspecified: Secondary | ICD-10-CM

## 2021-06-11 DIAGNOSIS — N179 Acute kidney failure, unspecified: Secondary | ICD-10-CM | POA: Insufficient documentation

## 2021-06-11 DIAGNOSIS — Z8616 Personal history of COVID-19: Secondary | ICD-10-CM

## 2021-06-11 DIAGNOSIS — I214 Non-ST elevation (NSTEMI) myocardial infarction: Secondary | ICD-10-CM | POA: Diagnosis not present

## 2021-06-11 DIAGNOSIS — R609 Edema, unspecified: Secondary | ICD-10-CM

## 2021-06-11 DIAGNOSIS — Z13220 Encounter for screening for lipoid disorders: Secondary | ICD-10-CM

## 2021-06-11 DIAGNOSIS — D219 Benign neoplasm of connective and other soft tissue, unspecified: Secondary | ICD-10-CM

## 2021-06-11 LAB — URINALYSIS, ROUTINE W REFLEX MICROSCOPIC
Bilirubin, UA: NEGATIVE
Glucose, UA: NEGATIVE
Ketones, UA: NEGATIVE
Leukocytes,UA: NEGATIVE
Nitrite, UA: NEGATIVE
Protein,UA: NEGATIVE
RBC, UA: NEGATIVE
Specific Gravity, UA: 1.02 (ref 1.005–1.030)
Urobilinogen, Ur: 0.2 mg/dL (ref 0.2–1.0)
pH, UA: 5 (ref 5.0–7.5)

## 2021-06-11 IMAGING — CT CT RENAL STONE PROTOCOL
2 of 4 series · 16 of 46 positions shown, 18 images · non-contrast
Comparison: None.

CLINICAL DATA: Flank pain, kidney stone suspected Felt a pop in her
L flank now with +CVA tenderness on the L and diffuse abdominal
pain, US normal at outside hospital

EXAM:
CT ABDOMEN AND PELVIS WITHOUT CONTRAST
TECHNIQUE: Multidetector CT imaging of the abdomen and pelvis was performed
following the standard protocol without IV contrast.

[Series 2: stone full standard · axial · 0.79mm/px · z∈[-482,-97]mm · 13 of 85 slices shown, 15 images]
[im 4/85  soft-tissue]
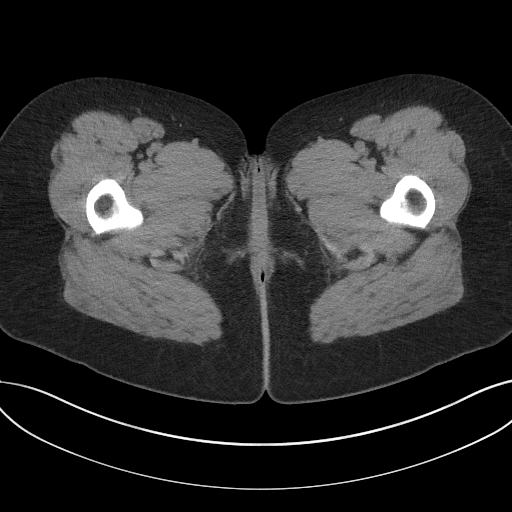
[im 4/85  bone]
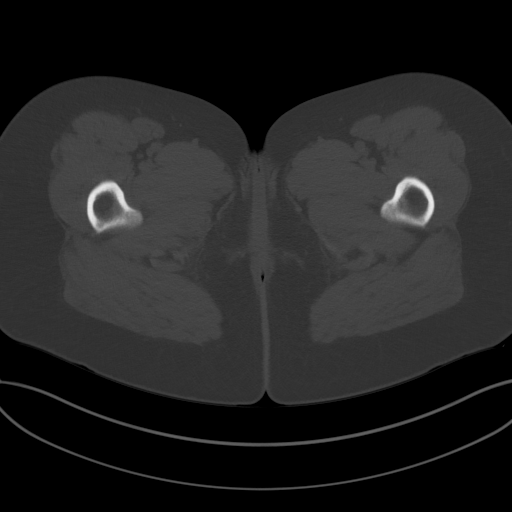
[im 11/85  soft-tissue]
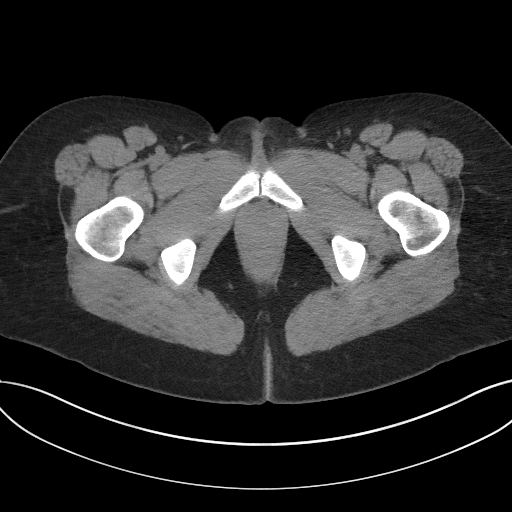
[im 17/85  soft-tissue]
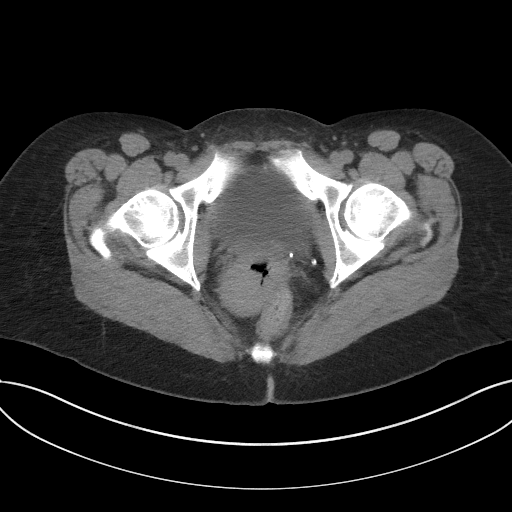
[im 24/85  soft-tissue]
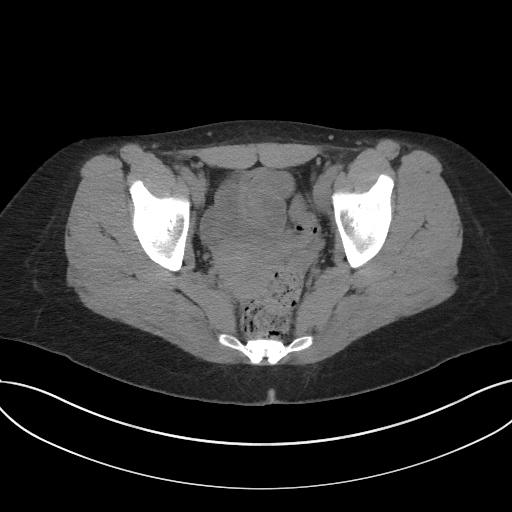
[im 31/85  soft-tissue]
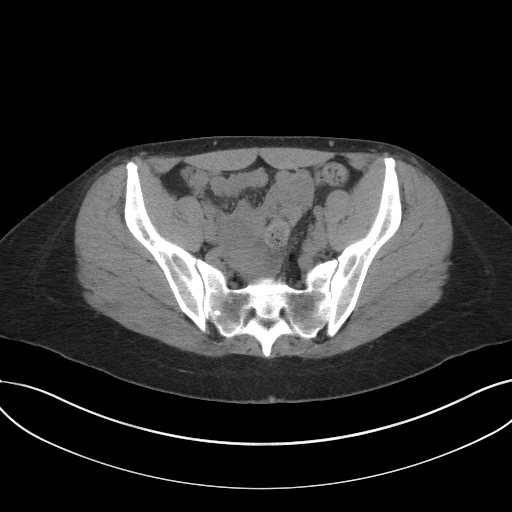
[im 37/85  soft-tissue]
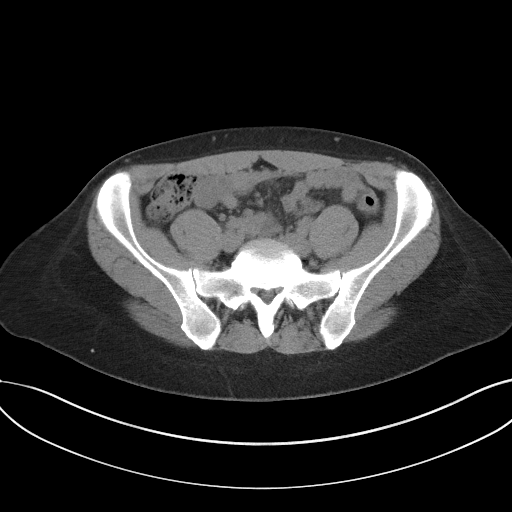
[im 44/85  soft-tissue]
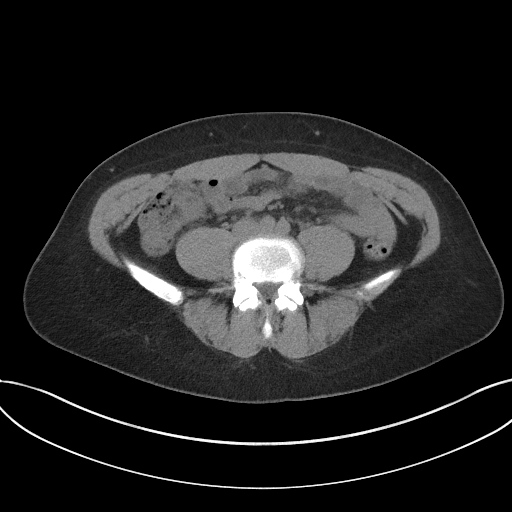
[im 48/85  soft-tissue]
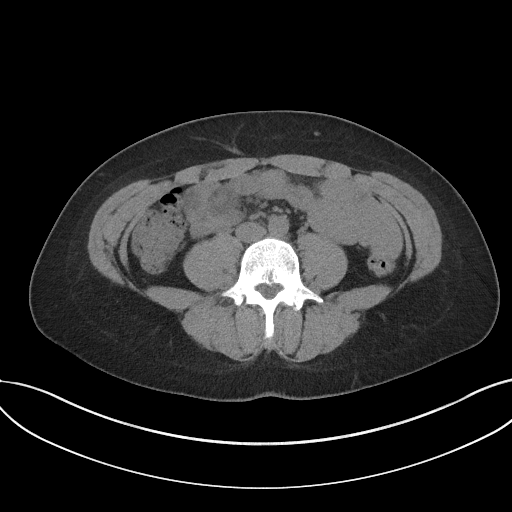
[im 54/85  soft-tissue]
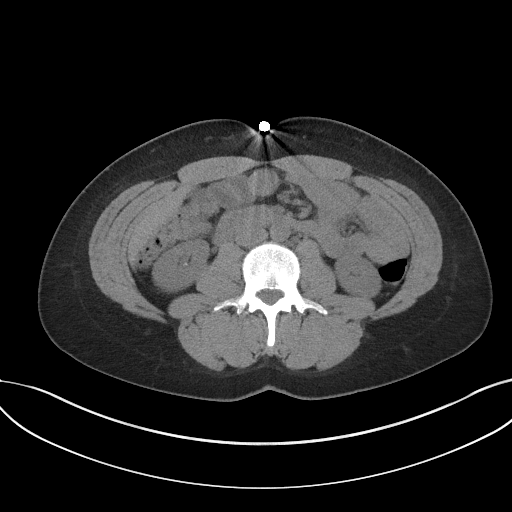
[im 54/85  bone]
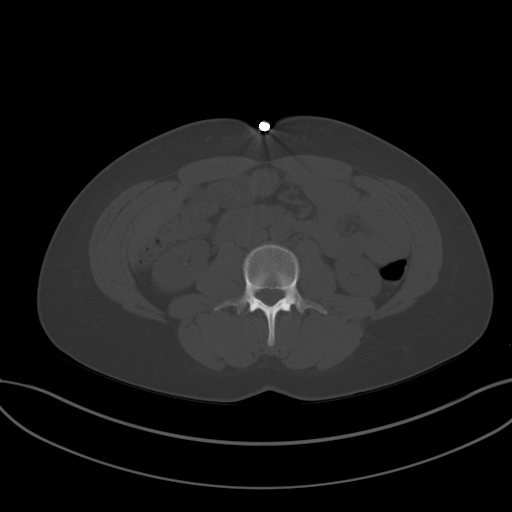
[im 61/85  soft-tissue]
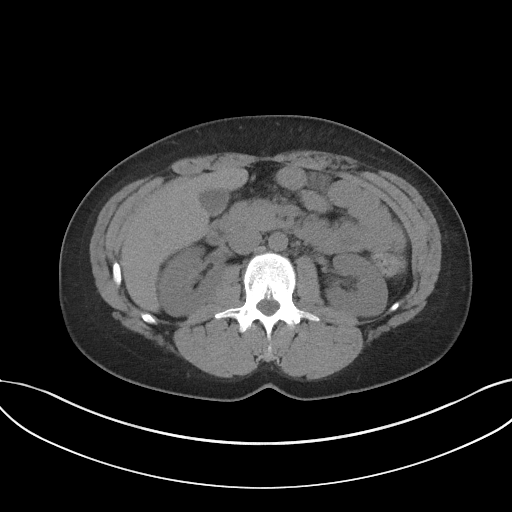
[im 68/85  soft-tissue]
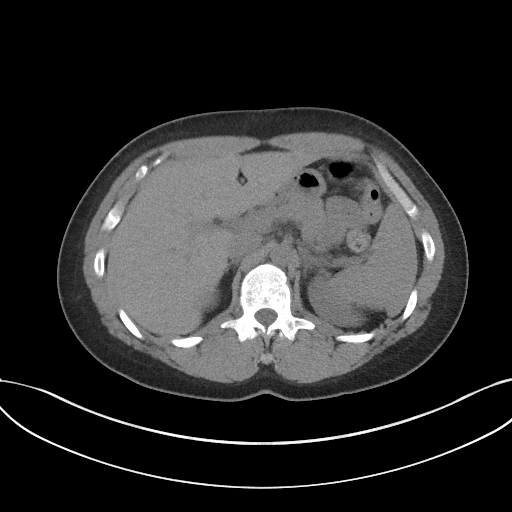
[im 74/85  soft-tissue]
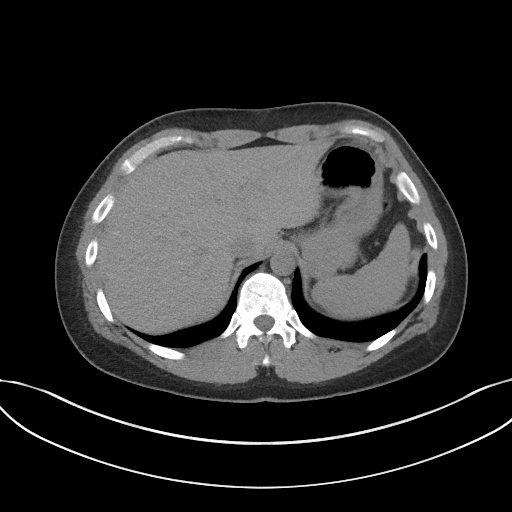
[im 81/85  soft-tissue]
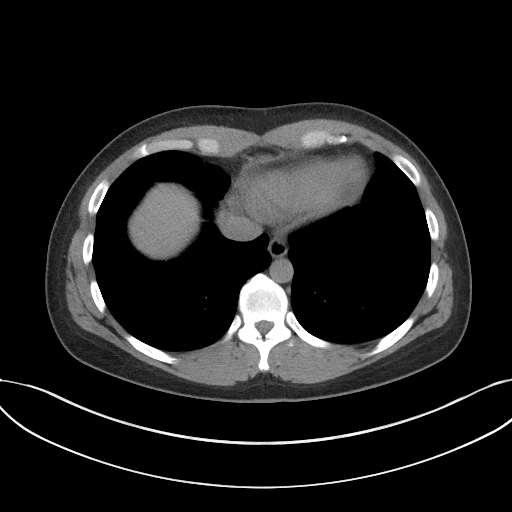

[Series 5: coronal · coronal · 0.84mm/px · 3 of 112 slices shown]
[im 38/112  soft-tissue]
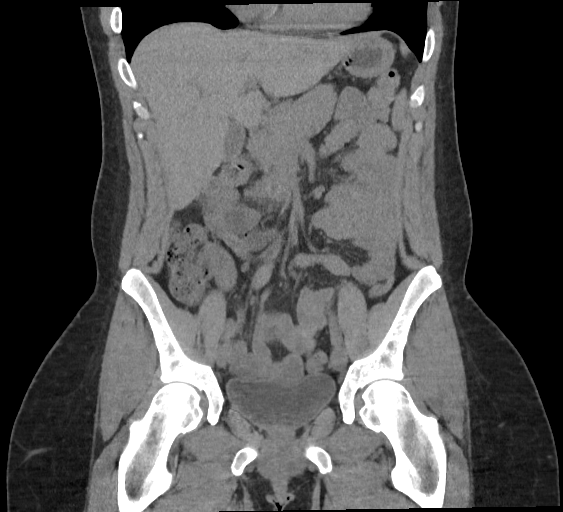
[im 50/112  soft-tissue]
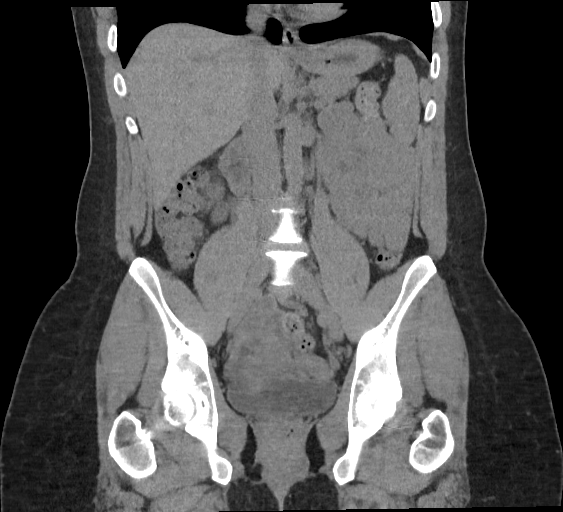
[im 62/112  soft-tissue]
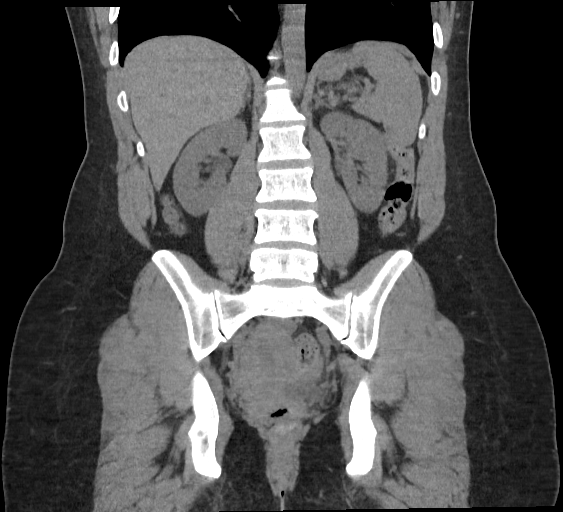

[16 of 46 positions shown; findings below may reference images not displayed]

FINDINGS: Lower chest: The lung bases are clear. No acute airspace disease or
pleural effusion.

Hepatobiliary: No focal liver abnormality is seen. No gallstones,
gallbladder wall thickening, or biliary dilatation.

Pancreas: No ductal dilatation or inflammation.

Spleen: Normal in size without focal abnormality.

Adrenals/Urinary Tract: Normal adrenal glands. There is mild
fullness both right and left renal pelvis but no calyceal dilatation
or frank hydronephrosis. No perinephric edema. There are multiple
punctate bilateral intrarenal calculi. Both ureters are
decompressed. No visualized ureteral stone. Urinary bladder is
physiologically distended. No bladder stone or wall thickening. Two
calcifications in the left pelvis likely represent phleboliths, and
are below the expected ureteral insertion.

Stomach/Bowel: Stomach is nondistended. There is no bowel
obstruction or inflammation. The appendix is normal small volume of
colonic stool.

Vascular/Lymphatic: Normal caliber abdominal aorta. No portal venous
or mesenteric gas. No bulky abdominopelvic lymph nodes.

Reproductive: Bulbous appearance of the uterus with fibroids.
Discrete fibroids are difficult to delineate on this unenhanced
exam, however anterior lower uterine fibroid measures at least
cm. The ovaries are not well-defined due to adjacent bowel loops. No
evidence of adnexal mass.

Other: No free fluid or ascites. No free air. No focal fluid
collection. No abdominal wall hernia.

Musculoskeletal: Scattered bone islands throughout the pelvis. There
are no acute or suspicious osseous abnormalities.
IMPRESSION: 1. Multiple punctate bilateral intrarenal calculi. No hydronephrosis
or obstructive uropathy.
2. Bulbous appearance of the uterus with fibroids. Discrete fibroids
are difficult to delineate on this unenhanced exam, however anterior
lower uterine fibroid measures at least 2.8 cm.

## 2021-06-11 NOTE — Assessment & Plan Note (Signed)
+  CVA tenderness and lower abdominal pain today going on for about 10 days now. [place] at outside ER was normal without ascites, but focused on RUQ- will obtain CT abdomen pelvis to r/o mass or kidney stone given pain and "popping" await results. Checking labs and urine today.

## 2021-06-11 NOTE — Progress Notes (Signed)
New Outpatient Visit Date: 06/12/2021  Referring Provider: Valerie Roys, DO [place],  [place] [id number]  Chief Complaint: Chest pain and recent hospitalization in [place] for demand ischemia  HPI:  Ms. Furnari is a 40 y.o. female who is being seen today for urgent evaluation of chest pain and recent hospitalization in [place], [place], with demand ischemia at the request of Dr. Wynetta Emery. She has a history of bipolar disorder, irritable bowel syndrome, and allergic rhinitis.  Ms. Odle reports that she has been feeling relatively well up until 1.5 weeks ago.  She was seated and suddenly developed pain in her left lower back with a feeling that something "burst."  She then began to feel swollen all over, fatigued, and short of breath.  She also noticed progressive pressure across her chest that lasted for several days.  She traveled to visit friends in [place], [place], just over a week ago and was still feeling chest pain.  She also "became sick" after eating, which prompted her to go to the ED there.  Work-up was notable for elevated high-sensitivity troponin I, which trended down throughout her hospital stay.  Initial troponin was 205.4 (ULN 51.4).  CTA of the chest was negative for PE.  Echocardiogram was normal with preserved LVEF and no wall motion abnormality.  It was felt that her troponin elevation reflected demand ischemia, as the patient had had COVID-19 in May (she reports very mild symptoms).  Today, Ms. Brouwer reports feeling much better with almost complete resolution of her symptoms.  She still has some low back pain that prompted CT of the abdomen and pelvis yesterday to rule out renal stones.  Punctate renal stones without evidence of obstruction were noted.  Uterine fibroid was also seen.  Ms. Zollner notes minimal vague left-sided chest pain.  Her chest pain has not been exertional, positional, or worsened with deep inspiration.  She received ibuprofen while hospitalized last week  without improvement.  She no longer has shortness of breath or edema.  She has not had any palpitations.  Ms. Mally denies fevers and chills.  She has not traveled prior to onset of symptoms 1.5 weeks ago.  She also denies rash and known tick bites.  --------------------------------------------------------------------------------------------------  Cardiovascular History & Procedures: Cardiovascular Problems: NSTEMI/demand ischemia  Risk Factors: None  Cath/PCI: None  CV Surgery: None  EP Procedures and Devices: None  Non-Invasive Evaluation(s): TTE [facility] [place]; 06/06/2021): Normal LV size and wall thickness.  LVEF 55-6% with normal wall motion and diastolic function.  Normal RV size and function.  No significant valvular abnormality.  Recent CV Pertinent Labs: Lab Results  Component Value Date   CHOL 192 06/11/2021   HDL 100 06/11/2021   LDLCALC 81 06/11/2021   TRIG 58 06/11/2021   K 4.4 06/11/2021   BUN 11 06/11/2021   CREATININE 0.93 06/11/2021    --------------------------------------------------------------------------------------------------  Past Medical History:  Diagnosis Date   Allergic rhinitis    Bipolar 2 disorder ([place])     Past Surgical History:  Procedure Laterality Date   birth mark removal     DENTAL SURGERY      Current Meds  Medication Sig   Azelastine HCl 137 MCG/SPRAY SOLN SMARTSIG:1-2 Spray(s) Both Nares Twice Daily   Calcium Carbonate-Vit D-Min (CALCIUM 1200 PO) Take by mouth.   cetirizine (ZYRTEC) 10 MG tablet Take 10 mg by mouth daily.   cholecalciferol (VITAMIN D) 1000 units tablet Take 1,000 Units by mouth daily.  ibuprofen (ADVIL,MOTRIN) 600 MG tablet Take 600 mg by mouth every 6 (six) hours as needed.   Magnesium 200 MG TABS Take 1 tablet by mouth daily.   Multiple Vitamin (MULTIVITAMIN) tablet Take 1 tablet by mouth daily.   NASACORT ALLERGY 24HR 55 MCG/ACT AERO nasal inhaler 2 sprays daily.    triamcinolone ointment (KENALOG) 0.5 % Apply 1 application topically 2 (two) times daily.    Allergies: Patient has no known allergies.  Social History   Tobacco Use   Smoking status: Never   Smokeless tobacco: Never  Vaping Use   Vaping Use: Never used  Substance Use Topics   Alcohol use: Yes    Comment: occ   Drug use: No    Family History  Problem Relation Age of Onset   Hypertension Mother    Hypertension Father    Breast cancer Maternal Grandmother    Heart disease Paternal Grandmother    Cancer Paternal Grandfather        Colon    Review of Systems: A 12-system review of systems was performed and was negative except as noted in the HPI.  --------------------------------------------------------------------------------------------------  Physical Exam: BP 110/70 (BP Location: Right Arm, Patient Position: Sitting, Cuff Size: Large)   Pulse 66   Ht 5\' 3"  (1.6 m)   Wt 179 lb (81.2 kg)   LMP 05/20/2021   SpO2 98%   BMI 31.71 kg/m   General: NAD. HEENT: No conjunctival pallor or scleral icterus. Facemask in place. Neck: Supple without lymphadenopathy, thyromegaly, JVD, or HJR. No carotid bruit. Lungs: Normal work of breathing. Clear to auscultation bilaterally without wheezes or crackles. Heart: Regular rate and rhythm without murmurs, rubs, or gallops. Non-displaced PMI. Abd: Bowel sounds present. Soft, NT/ND without hepatosplenomegaly Ext: No lower extremity edema. Radial, PT, and DP pulses are 2+ bilaterally Skin: Warm and dry without rash. Neuro: CNIII-XII intact. Strength and fine-touch sensation intact in upper and lower extremities bilaterally. Psych: Normal mood and affect.  EKG: Normal sinus rhythm without abnormality.  Lab Results  Component Value Date   WBC 6.9 06/11/2021   HGB 13.5 06/11/2021   HCT 40.5 06/11/2021   MCV 96 06/11/2021   PLT 210 06/11/2021    Lab Results  Component Value Date   NA 139 06/11/2021   K 4.4 06/11/2021   CL 101  06/11/2021   CO2 22 06/11/2021   BUN 11 06/11/2021   CREATININE 0.93 06/11/2021   GLUCOSE 81 06/11/2021   ALT 8 06/11/2021    Lab Results  Component Value Date   CHOL 192 06/11/2021   HDL 100 06/11/2021   LDLCALC 81 06/11/2021   TRIG 58 06/11/2021   --------------------------------------------------------------------------------------------------  ASSESSMENT AND PLAN: Chest pain and elevated troponin: Constellation of symptoms beginning 1.5 weeks ago and leading to hospitalization in [place] last week are nonspecific.  Only notable finding was mildly elevated high-sensitivity troponin and I attributed to demand ischemia.  Notably, no ischemia evaluation was performed at the outside hospital.  Today, Ms. Dikes reports feeling much better though she still has some mild vague chest discomfort that is atypical.  Her physical examination and EKG today are normal.  We discussed repeat ED evaluation but have agreed to defer this given the fact that recent work-up was reassuring and symptoms are almost completely gone.  We have agreed to empiric trial of a PPI; I have recommended omeprazole 20 mg daily.  We will also add aspirin 81 mg daily we have reviewed further evaluation options including stress testing,  coronary CTA, and cardiac catheterization.  After discussing the risks and benefits of these procedures, we have agreed to proceed with an exercise tolerance test if symptoms persist in spite of unrevealing ETT, we may need to consider cardiac MRI to exclude myocarditis.  I advised Ms. Leonardo to seek immediate medical attention if her chest pain worsens or other symptoms redevelop.  Follow-up: Return to clinic after completion of exercise tolerance test.  Nelva Bush, MD 06/12/2021 8:30 AM

## 2021-06-11 NOTE — Progress Notes (Signed)
BP 136/81   Pulse 68   Temp 98.4 F (36.9 C)   Wt 178 lb 12.8 oz (81.1 kg)   SpO2 98%   BMI 31.13 kg/m    Subjective:    Patient ID: Marilyn Francis, female    DOB: 10/05/1981, 40 y.o.   MRN: [id number]  HPI: Marilyn Francis is a 40 y.o. female  Chief Complaint  Patient presents with   Hospitalization Follow-up    Patient was recently discharged from hospital. Was seen in ED on 06/05/21 for swelling in entire body but more in extremities. Patient states she was having shortness of breath and chest pressure. Patient is also having lower left back pain that is still persistent. Patient was discharged 06/07/21. Patient states she is still having chest pressure and slight chest pain. Patient states she feels fatigue and low appetite.    Transition of Care Hospital Follow up.   Hospital/Facility: [facility] in [place] D/C Physician: Dr. Cristine Polio Paras D/C Date: 06/07/21  Records Requested:  06/11/21 Records Received:  06/11/21 Records Reviewed:  06/11/21  Diagnoses on Discharge: NSTEMI type 2 (Demand ischemia), AKI, BLE Edema, Recent COVID, Alcohol use  Date of interactive Contact within 48 hours of discharge: Today, 06/11/21 Contact was through: direct  Date of 7 day or 14 day face-to-face visit:   06/11/21  within 7 days  Outpatient Encounter Medications as of 06/11/2021  Medication Sig   Azelastine HCl 137 MCG/SPRAY SOLN SMARTSIG:1-2 Spray(s) Both Nares Twice Daily   Calcium Carbonate-Vit D-Min (CALCIUM 1200 PO) Take by mouth.   cetirizine (ZYRTEC) 10 MG tablet Take 10 mg by mouth daily.   cholecalciferol (VITAMIN D) 1000 units tablet Take 1,000 Units by mouth daily.   folic acid (FOLVITE) 1 MG tablet Take 1 tablet (1 mg total) by mouth daily.   ibuprofen (ADVIL,MOTRIN) 600 MG tablet Take 600 mg by mouth every 6 (six) hours as needed.   Magnesium 200 MG TABS Take 1 tablet by mouth daily.   Multiple Vitamin (MULTIVITAMIN) tablet Take 1 tablet by mouth daily.   multivitamin  with minerals (CERTA-VITE) LIQD Take 5 mLs by mouth daily.   NASACORT ALLERGY 24HR 55 MCG/ACT AERO nasal inhaler 2 sprays daily.   thiamine 100 MG tablet Take 1 tablet (100 mg total) by mouth daily.   triamcinolone ointment (KENALOG) 0.5 % Apply 1 application topically 2 (two) times daily.   [DISCONTINUED] albuterol (VENTOLIN HFA) 108 (90 Base) MCG/ACT inhaler Inhale 2 puffs into the lungs every 6 (six) hours as needed for wheezing or shortness of breath. (Patient not taking: No sig reported)   [DISCONTINUED] montelukast (SINGULAIR) 10 MG tablet Take 1 tablet (10 mg total) by mouth at bedtime. (Patient not taking: Reported on 04/03/2021)   [DISCONTINUED] omeprazole (PRILOSEC) 40 MG capsule Take 1 capsule (40 mg total) by mouth daily. (Patient not taking: Reported on 04/03/2021)   No facility-administered encounter medications on file as of 06/11/2021.   Per Hospitalist: " Hospital Course: Patient was seen in ED on 06/05/21 due to chest pain, swelling of extremities. She recently had COVID-19 in May 2022, in the ED, BP 146/85, temp 97.9, creatinine 1.19, troponin 205.4, ProBNP 127. EKG with no acute ST-T wave changes. CT chest without evidence for pulmonary embolism. Admitted to the hospital. Echo done with EF 55-60% with no regional wall motion abnormalities. BLE Venous Doppler US with no evidence of DVT. Abdominal US with no acute process visualized, no ascites visualized. Cardiology was consulted and felt patient thad  NSTEMI type II MI (demand ischemia). Chest discomfort very atypical for angina and despite having ongoing chest pain since admission troponin has been trending down and continuous telemetry and EKG has been unremarkable. There is no indication for any further ischemic cardiac evaluation at this time. As per cardiology, OK to DC home. DC home today."  Diagnostic Tests Reviewed:  Echo: EF 55-60% with no regional wall motion abnormalities BLE Venous Doppler US: No evidence of DVT Abdominal  US: No acute process visualized, no ascites visualized Saw cardiology- no further ischemic cardiac evaluation needed.  Serial troponins on downward trend.  Disposition: Home  Consults: Cardiology  Discharge Instructions: Follow up here and with cardiology within 1-2 weeks  Disease/illness Education: Discussed today  Home Health/Community Services Discussions/Referrals: N/A  Establishment or re-establishment of referral orders for community resources: N/A  Discussion with other health care providers: None  Assessment and Support of treatment regimen adherence: Excellent  Appointments Coordinated with: Patient   Education for self-management, independent living, and ADLs: Discussed today  Since getting out of the hospital she is feeling better. She notes that she is still having pain in her low back. Now having more pain rather than pressure in her chest. She has not had extra swelling in her legs, but still feeling not quite right. Still feeling tired. SOB and fatigue have been a bit more than usual, but otherwise doing OK. Has not really wanted to eat. She notes that her back and her belly are still not feeling normal at all. She notes that  they didn't really answer any of her questions when she was at the hospital and she didn't get any answers as to why this happened or what was going on with her back.   Marilyn Francis was in her normal state of health until Saturday 06/01/21. The day before she had been working outside, but has been very careful to take breaks and had drank a lot of water. She was actually feeling really good- did not feel sick at all. On 6/25, she felt a pop- more like a bubble bursting or an electrical feeling in her low back on her L side on Saturday about 10 days ago while not doing anything. It was painful, but did not feel muscular. It radiated into her L groin. She still felt well, and didn't think much of it. Monday, 06/03/21, she noted that she was swelling in her legs  and hands and just not feeling like herself. Went up to [place] to visit a friend on Tuesday, and then on Wed started to feel even worse and her friend took her to the ER. In the ER they diagnosed her with an AKI and demand ischemia, but they didn't say what caused it.   Relevant past medical, surgical, family and social history reviewed and updated as indicated. Interim medical history since our last visit reviewed. Allergies and medications reviewed and updated.  Review of Systems  Constitutional:  Positive for activity change, appetite change and fatigue. Negative for chills, diaphoresis, fever and unexpected weight change.  Respiratory:  Positive for chest tightness and shortness of breath. Negative for apnea, cough, choking, wheezing and stridor.   Cardiovascular:  Positive for chest pain, palpitations and leg swelling.  Gastrointestinal:  Positive for abdominal pain. Negative for abdominal distention, anal bleeding, blood in stool, constipation, diarrhea, nausea, rectal pain and vomiting.  Genitourinary:  Positive for flank pain. Negative for decreased urine volume, difficulty urinating, dyspareunia, dysuria, enuresis, frequency, genital sores, hematuria, menstrual problem, pelvic  pain, urgency, vaginal bleeding, vaginal discharge and vaginal pain.  Musculoskeletal:  Positive for back pain and myalgias. Negative for arthralgias, gait problem, joint swelling, neck pain and neck stiffness.  Skin: Negative.   Neurological: Negative.   Psychiatric/Behavioral: Negative.     Per HPI unless specifically indicated above     Objective:    BP 136/81   Pulse 68   Temp 98.4 F (36.9 C)   Wt 178 lb 12.8 oz (81.1 kg)   SpO2 98%   BMI 31.13 kg/m   Wt Readings from Last 3 Encounters:  06/11/21 178 lb 12.8 oz (81.1 kg)  12/05/20 173 lb 6.4 oz (78.7 kg)  11/05/20 170 lb 12.8 oz (77.5 kg)    Physical Exam Vitals and nursing note reviewed.  Constitutional:      General: She is not in acute  distress.    Appearance: Normal appearance. She is not ill-appearing, toxic-appearing or diaphoretic.  HENT:     Head: Normocephalic and atraumatic.     Right Ear: External ear normal.     Left Ear: External ear normal.     Nose: Nose normal.     Mouth/Throat:     Mouth: Mucous membranes are moist.     Pharynx: Oropharynx is clear.  Eyes:     General: No scleral icterus.       Right eye: No discharge.        Left eye: No discharge.     Extraocular Movements: Extraocular movements intact.     Conjunctiva/sclera: Conjunctivae normal.     Pupils: Pupils are equal, round, and reactive to light.  Cardiovascular:     Rate and Rhythm: Normal rate and regular rhythm.     Pulses: Normal pulses.     Heart sounds: Normal heart sounds. No murmur heard.   No friction rub. No gallop.  Pulmonary:     Effort: Pulmonary effort is normal. No respiratory distress.     Breath sounds: Normal breath sounds. No stridor. No wheezing, rhonchi or rales.  Chest:     Chest wall: No tenderness.  Abdominal:     General: Abdomen is flat. Bowel sounds are normal. There is no distension.     Palpations: Abdomen is soft. There is no mass.     Tenderness: There is generalized abdominal tenderness. There is left CVA tenderness. There is no right CVA tenderness, guarding or rebound.     Hernia: No hernia is present.     Comments: Especially lower quadrants bilaterally  Musculoskeletal:        General: Tenderness (L lumbar) present. No swelling, deformity or signs of injury. Normal range of motion.     Cervical back: Normal range of motion and neck supple.     Right lower leg: No edema.     Left lower leg: No edema.  Skin:    General: Skin is warm and dry.     Capillary Refill: Capillary refill takes less than 2 seconds.     Coloration: Skin is not jaundiced or pale.     Findings: No bruising, erythema, lesion or rash.  Neurological:     General: No focal deficit present.     Mental Status: She is alert and  oriented to person, place, and time. Mental status is at baseline.  Psychiatric:        Mood and Affect: Mood normal.        Behavior: Behavior normal.        Thought Content: Thought content normal.  Judgment: Judgment normal.    Results for orders placed or performed in visit on 04/08/21  HM HIV SCREENING LAB  Result Value Ref Range   HM HIV Screening Negative - Validated       Assessment & Plan:   Problem List Items Addressed This Visit       Cardiovascular and Mediastinum   NSTEMI (non-ST elevated myocardial infarction) [facility]) - Primary    Cardiology in [place] did not think she needed further ischemic work-up, but still unclear as to why she had demand ischemia when she had been healthy and feeling well. Will get her hooked up with cardiology locally. Labs checked today. Cholesterol was great in 2021- repeating today. Await results.        Relevant Orders   CBC with Differential/Platelet   Comprehensive metabolic panel   TSH   Ambulatory referral to Cardiology     Other   Lower abdominal pain    +CVA tenderness and lower abdominal pain today going on for about 10 days now. [place] at outside ER was normal without ascites, but focused on RUQ- will obtain CT abdomen pelvis to r/o mass or kidney stone given pain and "popping" await results. Checking labs and urine today.       Relevant Orders   CBC with Differential/Platelet   Comprehensive metabolic panel   TSH   Urinalysis, Routine w reflex microscopic   CT Abdomen Pelvis Wo Contrast   Other Visit Diagnoses     Peripheral edema       Now resolved. Of unclear etiology. Checking labs. Await results.    Relevant Orders   CBC with Differential/Platelet   Comprehensive metabolic panel   TSH   AKI (acute kidney injury) ([place])       Resolved before she left the hospital. Will recheck labs today. Unclear why she had AKI- normally very healthy.   Relevant Orders   CBC with Differential/Platelet   Comprehensive metabolic  panel   TSH   CT Abdomen Pelvis Wo Contrast   History of COVID-19       In May. Has recovered. Unclear if this is related to her symptoms.    Relevant Orders   CBC with Differential/Platelet   Comprehensive metabolic panel   TSH   Screening for cholesterol level       Labs drawn today. Await results. Treat as needed.    Relevant Orders   Lipid Panel w/o Chol/HDL Ratio        Follow up plan: No follow-ups on file.  >30 minutes spent with patient today.

## 2021-06-11 NOTE — Assessment & Plan Note (Signed)
Cardiology in [place] did not think she needed further ischemic work-up, but still unclear as to why she had demand ischemia when she had been healthy and feeling well. Will get her hooked up with cardiology locally. Labs checked today. Cholesterol was great in 2021- repeating today. Await results.

## 2021-06-12 ENCOUNTER — Encounter: Payer: Self-pay | Admitting: Internal Medicine

## 2021-06-12 ENCOUNTER — Telehealth: Payer: Self-pay

## 2021-06-12 ENCOUNTER — Ambulatory Visit (INDEPENDENT_AMBULATORY_CARE_PROVIDER_SITE_OTHER): Payer: 59 | Admitting: Internal Medicine

## 2021-06-12 VITALS — BP 110/70 | HR 66 | Ht 63.0 in | Wt 179.0 lb

## 2021-06-12 DIAGNOSIS — R7989 Other specified abnormal findings of blood chemistry: Secondary | ICD-10-CM | POA: Insufficient documentation

## 2021-06-12 DIAGNOSIS — R0789 Other chest pain: Secondary | ICD-10-CM

## 2021-06-12 DIAGNOSIS — R778 Other specified abnormalities of plasma proteins: Secondary | ICD-10-CM | POA: Insufficient documentation

## 2021-06-12 LAB — COMPREHENSIVE METABOLIC PANEL
ALT: 8 IU/L (ref 0–32)
AST: 13 IU/L (ref 0–40)
Albumin/Globulin Ratio: 2.5 — ABNORMAL HIGH (ref 1.2–2.2)
Albumin: 4.7 g/dL (ref 3.8–4.8)
Alkaline Phosphatase: 60 IU/L (ref 44–121)
BUN/Creatinine Ratio: 12 (ref 9–23)
BUN: 11 mg/dL (ref 6–24)
Bilirubin Total: 0.5 mg/dL (ref 0.0–1.2)
CO2: 22 mmol/L (ref 20–29)
Calcium: 9.6 mg/dL (ref 8.7–10.2)
Chloride: 101 mmol/L (ref 96–106)
Creatinine, Ser: 0.93 mg/dL (ref 0.57–1.00)
Globulin, Total: 1.9 g/dL (ref 1.5–4.5)
Glucose: 81 mg/dL (ref 65–99)
Potassium: 4.4 mmol/L (ref 3.5–5.2)
Sodium: 139 mmol/L (ref 134–144)
Total Protein: 6.6 g/dL (ref 6.0–8.5)
eGFR: 79 mL/min/{1.73_m2} (ref >59)

## 2021-06-12 LAB — CBC WITH DIFFERENTIAL/PLATELET
Basophils Absolute: 0 10*3/uL (ref 0.0–0.2)
Basos: 0 %
EOS (ABSOLUTE): 0.2 10*3/uL (ref 0.0–0.4)
Eos: 3 %
Hematocrit: 40.5 % (ref 34.0–46.6)
Hemoglobin: 13.5 g/dL (ref 11.1–15.9)
Immature Grans (Abs): 0 10*3/uL (ref 0.0–0.1)
Immature Granulocytes: 0 %
Lymphocytes Absolute: 1.6 10*3/uL (ref 0.7–3.1)
Lymphs: 24 %
MCH: 31.8 pg (ref 26.6–33.0)
MCHC: 33.3 g/dL (ref 31.5–35.7)
MCV: 96 fL (ref 79–97)
Monocytes Absolute: 0.4 10*3/uL (ref 0.1–0.9)
Monocytes: 6 %
Neutrophils Absolute: 4.6 10*3/uL (ref 1.4–7.0)
Neutrophils: 67 %
Platelets: 210 10*3/uL (ref 150–450)
RBC: 4.24 x10E6/uL (ref 3.77–5.28)
RDW: 13.2 % (ref 11.7–15.4)
WBC: 6.9 10*3/uL (ref 3.4–10.8)

## 2021-06-12 LAB — LIPID PANEL W/O CHOL/HDL RATIO
Cholesterol, Total: 192 mg/dL (ref 100–199)
HDL: 100 mg/dL (ref >39)
LDL Chol Calc (NIH): 81 mg/dL (ref 0–99)
Triglycerides: 58 mg/dL (ref 0–149)
VLDL Cholesterol Cal: 11 mg/dL (ref 5–40)

## 2021-06-12 LAB — TSH: TSH: 2.62 u[IU]/mL (ref 0.450–4.500)

## 2021-06-12 MED ORDER — ASPIRIN EC 81 MG PO TBEC: 81.0000 mg | DELAYED_RELEASE_TABLET | Freq: Every day | ORAL | Status: DC

## 2021-06-12 MED ORDER — OMEPRAZOLE 20 MG PO CPDR: 20.0000 mg | DELAYED_RELEASE_CAPSULE | Freq: Every day | ORAL | Status: DC

## 2021-06-12 NOTE — Patient Instructions (Signed)
Medication Instructions:  Your physician has recommended you make the following change in your medication:  START Aspirin 81 mg daily  START Omeprazole 20 mg daily  These can be purchased over the counter.  *If you need a refill on your cardiac medications before your next appointment, please call your pharmacy*   Lab Work: None ordered  If you have labs (blood work) drawn today and your tests are completely normal, you will receive your results only by: [place] (if you have MyChart) OR A paper copy in the mail If you have any lab test that is abnormal or we need to change your treatment, we will call you to review the results.   Testing/Procedures:  Your physician has requested that you have an exercise tolerance test. For further information please visit [email or web address]. Please also follow instruction sheet, as given.  - you may eat a light breakfast/ lunch prior to your procedure - no caffeine for 24 hours prior to your test (coffee, tea, soft drinks, or chocolate)  - no smoking/ vaping for 4 hours prior to your test - you may take your regular medications the day of your test  - bring any inhalers with you to your test - wear comfortable clothing & tennis/ non-skid shoes to walk on the treadmill     Follow-Up: At [facility], you and your health needs are our priority.  As part of our continuing mission to provide you with exceptional heart care, we have created designated Provider Care Teams.  These Care Teams include your primary Cardiologist (physician) and Advanced Practice Providers (APPs -  Physician Assistants and Nurse Practitioners) who all work together to provide you with the care you need, when you need it.  We recommend signing up for the patient portal called "MyChart".  Sign up information is provided on this After Visit Summary.  MyChart is used to connect with patients for Virtual Visits (Telemedicine).  Patients are able to view lab/test  results, encounter notes, upcoming appointments, etc.  Non-urgent messages can be sent to your provider as well.   To learn more about what you can do with MyChart, go to [email or web address].    Your next appointment:   After testing  The format for your next appointment:   In Person  Provider:   You may see Nelva Bush, MD or one of the following Advanced Practice Providers on your designated Care Team:   Murray Hodgkins, NP Christell Faith, PA-C Marrianne Mood, PA-C Cadence Morr[place], [place] Laurann Montana, NP   Other Instructions N/A

## 2021-06-12 NOTE — Telephone Encounter (Signed)
[facility] referring for Fibroids, Lower abdominal pain. Called and left voicemail for patient to call back to be scheduled.

## 2021-06-13 NOTE — Telephone Encounter (Signed)
Patient coming in on 07/21 @ 8:40 with [facility]

## 2021-06-25 ENCOUNTER — Ambulatory Visit (INDEPENDENT_AMBULATORY_CARE_PROVIDER_SITE_OTHER): Payer: 59

## 2021-06-25 ENCOUNTER — Other Ambulatory Visit: Payer: Self-pay

## 2021-06-25 DIAGNOSIS — R0789 Other chest pain: Secondary | ICD-10-CM

## 2021-06-25 LAB — EXERCISE TOLERANCE TEST
Estimated workload: 13.4 METS
Exercise duration (min): 11 min
Exercise duration (sec): 30 s
MPHR: 179 {beats}/min
Peak HR: 179 {beats}/min
Percent HR: 100 %
RPE: 17
Rest HR: 75 {beats}/min

## 2021-06-26 ENCOUNTER — Encounter: Payer: Self-pay | Admitting: Family Medicine

## 2021-06-26 ENCOUNTER — Ambulatory Visit (INDEPENDENT_AMBULATORY_CARE_PROVIDER_SITE_OTHER): Payer: 59 | Admitting: Family Medicine

## 2021-06-26 VITALS — BP 111/75 | HR 62 | Temp 98.3°F | Wt 180.0 lb

## 2021-06-26 DIAGNOSIS — R1030 Lower abdominal pain, unspecified: Secondary | ICD-10-CM

## 2021-06-26 DIAGNOSIS — M791 Myalgia, unspecified site: Secondary | ICD-10-CM

## 2021-06-26 DIAGNOSIS — M5416 Radiculopathy, lumbar region: Secondary | ICD-10-CM | POA: Diagnosis not present

## 2021-06-26 NOTE — Progress Notes (Signed)
BP 111/75   Pulse 62   Temp 98.3 F (36.8 C)   Wt 180 lb (81.6 kg)   LMP 06/17/2021   SpO2 98%   BMI 31.89 kg/m    Subjective:    Patient ID: Marilyn Francis, female    DOB: [birth year], 40 y.o.   MRN: [id number]  HPI: Marilyn Francis is a 40 y.o. female  Chief Complaint  Patient presents with   NSTEMI     Follow up, saw cardiology recently. Had stress test done.    lower abdominal pain    Follow up, patient states she has apt with GYN tomorrow    She saw cardiology and thankfully everything looks nice and normal. She continues with pain in her L side going into her abdomen. Continues with some swelling in her legs that is coming and going. More in the calves, more R>L, still happening about every day, having some tingling feelings. She had a DVT US in the hospital which was negative. She is still having issues with constipation- has been doing probiotics and fiber without benefit. Still having issues with palpitations. She is seeing GYN tomorrow. She has been having issues with her lower back. Has been doing home exercises and PT for 6 months without a lot of benefits Feels like her stomach is not emptying the way it's supposed to.   BACK PAIN Duration: 6-9 months Mechanism of injury: unknown Location: L>R and low back Onset: gradual Severity: moderate Quality: aching and sore Frequency: constant Radiation: R leg below the knee Aggravating factors: lifting, movement, and walking Alleviating factors:  Home exercise program, PT, rest, ice, heat, laying, NSAIDs, APAP, and muscle relaxer Status: worse Treatments attempted: rest, ice, heat, APAP, ibuprofen, aleve, physical therapy, and HEP  Relief with NSAIDs?: mild Nighttime pain:  no Paresthesias / decreased sensation:  yes Bowel / bladder incontinence:  no Fevers:  no Dysuria / urinary frequency:  no   Relevant past medical, surgical, family and social history reviewed and updated as indicated. Interim medical  history since our last visit reviewed. Allergies and medications reviewed and updated.  Review of Systems  Constitutional: Negative.   Respiratory: Negative.    Cardiovascular:  Positive for palpitations and leg swelling. Negative for chest pain.  Gastrointestinal:  Positive for abdominal distention, abdominal pain and constipation. Negative for anal bleeding, blood in stool, diarrhea, nausea, rectal pain and vomiting.  Genitourinary: Negative.   Musculoskeletal: Negative.   Skin: Negative.   Psychiatric/Behavioral: Negative.     Per HPI unless specifically indicated above     Objective:    BP 111/75   Pulse 62   Temp 98.3 F (36.8 C)   Wt 180 lb (81.6 kg)   LMP 06/17/2021   SpO2 98%   BMI 31.89 kg/m   Wt Readings from Last 3 Encounters:  06/27/21 180 lb (81.6 kg)  06/26/21 180 lb (81.6 kg)  06/12/21 179 lb (81.2 kg)    Physical Exam Vitals and nursing note reviewed.  Constitutional:      General: She is not in acute distress.    Appearance: Normal appearance. She is not ill-appearing, toxic-appearing or diaphoretic.  HENT:     Head: Normocephalic and atraumatic.     Right Ear: External ear normal.     Left Ear: External ear normal.     Nose: Nose normal.     Mouth/Throat:     Mouth: Mucous membranes are moist.     Pharynx: Oropharynx is clear.  Eyes:  General: No scleral icterus.       Right eye: No discharge.        Left eye: No discharge.     Extraocular Movements: Extraocular movements intact.     Conjunctiva/sclera: Conjunctivae normal.     Pupils: Pupils are equal, round, and reactive to light.  Cardiovascular:     Rate and Rhythm: Normal rate and regular rhythm.     Pulses: Normal pulses.     Heart sounds: Normal heart sounds. No murmur heard.   No friction rub. No gallop.  Pulmonary:     Effort: Pulmonary effort is normal. No respiratory distress.     Breath sounds: Normal breath sounds. No stridor. No wheezing, rhonchi or rales.  Chest:      Chest wall: No tenderness.  Musculoskeletal:        General: Normal range of motion.     Cervical back: Normal range of motion and neck supple.  Skin:    General: Skin is warm and dry.     Capillary Refill: Capillary refill takes less than 2 seconds.     Coloration: Skin is not jaundiced or pale.     Findings: No bruising, erythema, lesion or rash.  Neurological:     General: No focal deficit present.     Mental Status: She is alert and oriented to person, place, and time. Mental status is at baseline.  Psychiatric:        Mood and Affect: Mood normal.        Behavior: Behavior normal.        Thought Content: Thought content normal.        Judgment: Judgment normal.    Results for orders placed or performed in visit on 72/90/21  Ehrlichia Antibody Panel  Result Value Ref Range   E.Chaffeensis (HME) IgG WILL FOLLOW    E. Chaffeensis (HME) IgM Titer WILL FOLLOW    HGE IgG Titer WILL FOLLOW    HGE IgM Titer WILL FOLLOW   Rocky mtn spotted fvr abs pnl(IgG+IgM)  Result Value Ref Range   RMSF IgG Negative Negative   RMSF IgM WILL FOLLOW   Lyme Disease Serology w/Reflex  Result Value Ref Range   Lyme Total Antibody EIA Negative Negative  Babesia microti Antibody Panel  Result Value Ref Range   Babesia microti IgM <1:10 Neg:<1:10   Babesia microti IgG <1:10 Neg:<1:10  Antinuclear Antib (ANA)  Result Value Ref Range   Anti Nuclear Antibody (ANA) Negative Negative  Sed Rate (ESR)  Result Value Ref Range   Sed Rate 2 0 - 32 mm/hr  CK (Creatine Kinase)  Result Value Ref Range   Total CK 68 32 - 182 U/L  Vitamin B1  Result Value Ref Range   Thiamine WILL FOLLOW   Folate  Result Value Ref Range   Folate >20.0 >3.0 ng/mL  B12  Result Value Ref Range   Vitamin B-12 649 232 - 1,245 pg/mL  Vitamin D (25 hydroxy)  Result Value Ref Range   Vit D, 25-Hydroxy 48.7 30.0 - 100.0 ng/mL  RA Qn+CCP(IgG/A)+SjoSSA+SjoSSB  Result Value Ref Range   Rhuematoid fact SerPl-aCnc <10.0 <14.0  IU/mL   ENA SSA (RO) Ab <0.2 0.0 - 0.9 AI   ENA SSB (LA) Ab <0.2 0.0 - 0.9 AI   Cyclic Citrullin Peptide Ab 6 0 - 19 units      Assessment & Plan:   Problem List Items Addressed This Visit       Other  Lower abdominal pain   Other Visit Diagnoses     Lumbar radiculopathy    -  Primary   Has failed PT and home exercise program x 6 months, getting worse. Will obtain MRI. Await results.    Relevant Orders   Ehrlichia Antibody Panel (Completed)   Rocky mtn spotted fvr abs pnl(IgG+IgM) (Completed)   Lyme Disease Serology w/Reflex (Completed)   Babesia microti Antibody Panel (Completed)   Antinuclear Antib (ANA) (Completed)   Sed Rate (ESR) (Completed)   CK (Creatine Kinase) (Completed)   Vitamin B1 (Completed)   Folate (Completed)   B12 (Completed)   Vitamin D (25 hydroxy) (Completed)   MR Lumbar Spine Wo Contrast   RA Qn+CCP(IgG/A)+SjoSSA+SjoSSB (Completed)   Myalgia       Will check labs including tick diseases- await results.    Relevant Orders   Ehrlichia Antibody Panel (Completed)   Rocky mtn spotted fvr abs pnl(IgG+IgM) (Completed)   Lyme Disease Serology w/Reflex (Completed)   Babesia microti Antibody Panel (Completed)   Antinuclear Antib (ANA) (Completed)   Sed Rate (ESR) (Completed)   CK (Creatine Kinase) (Completed)   Vitamin B1 (Completed)   Folate (Completed)   B12 (Completed)   Vitamin D (25 hydroxy) (Completed)   MR Lumbar Spine Wo Contrast   RA Qn+CCP(IgG/A)+SjoSSA+SjoSSB (Completed)        Follow up plan: No follow-ups on file.   >30 minutes spent with patient today

## 2021-06-27 ENCOUNTER — Other Ambulatory Visit: Payer: Self-pay

## 2021-06-27 ENCOUNTER — Ambulatory Visit (INDEPENDENT_AMBULATORY_CARE_PROVIDER_SITE_OTHER): Payer: 59 | Admitting: Obstetrics & Gynecology

## 2021-06-27 ENCOUNTER — Encounter: Payer: Self-pay | Admitting: Obstetrics & Gynecology

## 2021-06-27 VITALS — BP 120/80 | Ht 63.0 in | Wt 180.0 lb

## 2021-06-27 DIAGNOSIS — D219 Benign neoplasm of connective and other soft tissue, unspecified: Secondary | ICD-10-CM

## 2021-06-27 LAB — LYME DISEASE SEROLOGY W/REFLEX: Lyme Total Antibody EIA: NEGATIVE

## 2021-06-27 NOTE — Progress Notes (Signed)
Uterine Fibroids Patient is a 40 yo G0 WF who presents with uterine fibroids. Periods are regular every 28-30 days, lasting a few days. Dysmenorrhea:none. Cyclic symptoms include none. No intermenstrual bleeding, spotting, or discharge.  She reports recent low back pain and had CT for this and other reasons.  CT did display a 2.8cm uterine fibroid.  Denies bleeding, pain, or bladder sx's related to this.  No prior h/o known fibroids or other GYN concerns.  PMHx: She  has a past medical history of Allergic rhinitis and Bipolar 2 disorder ([place]). Also,  has a past surgical history that includes birth mark removal and Dental surgery., family history includes Breast cancer in her maternal grandmother; Cancer in her paternal grandfather; Heart disease in her paternal grandmother; Hypertension in her father and mother.,  reports that she has never smoked. She has never used smokeless tobacco. She reports current alcohol use of about 10.0 standard drinks of alcohol per week. She reports that she does not use drugs.  She has a current medication list which includes the following prescription(s): aspirin ec, azelastine hcl, calcium carbonate-vit d-min, cetirizine, cholecalciferol, ibuprofen, magnesium, multivitamin, nasacort allergy 24hr, omeprazole, and triamcinolone ointment. Also, has No Known Allergies.  Review of Systems  Constitutional:  Negative for chills, fever and malaise/fatigue.  HENT:  Negative for congestion, sinus pain and sore throat.   Eyes:  Negative for blurred vision and pain.  Respiratory:  Negative for cough and wheezing.   Cardiovascular:  Negative for chest pain and leg swelling.  Gastrointestinal:  Negative for abdominal pain, constipation, diarrhea, heartburn, nausea and vomiting.  Genitourinary:  Negative for dysuria, frequency, hematuria and urgency.  Musculoskeletal:  Negative for back pain, joint pain, myalgias and neck pain.  Skin:  Negative for itching and rash.   Neurological:  Negative for dizziness, tremors and weakness.  Endo/Heme/Allergies:  Does not bruise/bleed easily.  Psychiatric/Behavioral:  Negative for depression. The patient is not nervous/anxious and does not have insomnia.    Objective: BP 120/80   Ht 5\' 3"  (1.6 m)   Wt 180 lb (81.6 kg)   LMP 06/17/2021   BMI 31.89 kg/m  Physical Exam Constitutional:      General: She is not in acute distress.    Appearance: She is well-developed.  Genitourinary:     Right Labia: No rash or tenderness.    Left Labia: No tenderness or rash.    No vaginal erythema or bleeding.      Right Adnexa: not tender and no mass present.    Left Adnexa: not tender and no mass present.    No cervical motion tenderness, discharge, polyp or nabothian cyst.     Uterus is not enlarged.     No uterine mass detected.    Pelvic exam was performed with patient in the lithotomy position.  HENT:     Head: Normocephalic and atraumatic.     Nose: Nose normal.  Abdominal:     General: There is no distension.     Palpations: Abdomen is soft.     Tenderness: There is no abdominal tenderness.  Musculoskeletal:        General: Normal range of motion.  Neurological:     Mental Status: She is alert and oriented to person, place, and time.     Cranial Nerves: No cranial nerve deficit.  Skin:    General: Skin is warm and dry.  Psychiatric:        Attention and Perception: Attention normal.  Mood and Affect: Mood and affect normal.        Speech: Speech normal.        Behavior: Behavior normal.        Thought Content: Thought content normal.        Judgment: Judgment normal.    ASSESSMENT/PLAN:   Problem List Items Addressed This Visit     Fibroid    -  Primary  Fibroid treatment such as [place], Lupron, Myomectomy, and Hysterectomy discussed in detail, with the pros and cons of each choice counseled.  No treatment as an option also discussed, as well as control of symptoms alone with hormone therapy.  Information provided to the patient. As no sx's, there is no need for further treatment.  Will hold [place] for now, and base [place] and tx on whether sx;s develop over time.  This is not the etiology for her pain.     Barnett Applebaum, MD, [facility], [facility] 06/27/2021  9:21 AM

## 2021-06-27 NOTE — Patient Instructions (Signed)
Uterine Fibroids ?Uterine fibroids, also called leiomyomas, are noncancerous (benign) tumors that can grow in the uterus. They can cause heavy menstrual bleeding and pain. Fibroids may also grow in the fallopian tubes, cervix, or tissues (ligaments) near the uterus. ?You may have one or many fibroids. Fibroids vary in size, weight, and where they grow in the uterus. Some can become quite large. Most fibroids do not require medical treatment. ?What are the causes? ?The cause of this condition is not known. ?What increases the risk? ?You are more likely to develop this condition if you: ?Are in your 30s or 40s and have not gone through menopause. ?Have a family history of this condition. ?Are of African American descent. ?Started your menstrual period at age 10 or younger. ?Have never given birth. ?Are overweight or obese. ?What are the signs or symptoms? ?Many women do not have any symptoms. Symptoms of this condition may include: ?Heavy menstrual bleeding. ?Bleeding between menstrual periods. ?Pain and pressure in the pelvic area, between your hip bones. ?Pain during sex. ?Bladder problems, such as needing to urinate right away or more often than usual. ?Inability to have children (infertility). ?Failure to carry pregnancy to term (miscarriage). ?How is this diagnosed? ?This condition may be diagnosed based on: ?Your symptoms and medical history. ?A physical exam. ?A pelvic exam that includes feeling for any tumors. ?Imaging tests, such as ultrasound or MRI. ?How is this treated? ?Treatment for this condition may include follow-up visits with your health care provider to monitor your fibroids for any changes. Other treatment may include: ?Medicines, such as: ?Medicines to relieve pain, including aspirin and NSAIDs, such as ibuprofen or naproxen. ?Hormone therapy. Treatment may be given as a pill or an injection, or it may be inserted into the uterus using an intrauterine device (IUD). ?Surgery that would do one of  the following: ?Remove the fibroids (myomectomy). This may be recommended if fibroids affect your fertility and you want to become pregnant. ?Remove the uterus (hysterectomy). ?Block the blood supply to the fibroids (uterine artery embolization). This can cause them to shrink and die. ?Follow these instructions at home: ?Medicines ?Take over-the-counter and prescription medicines only as told by your health care provider. ?Ask your health care provider if you should take iron pills or eat more iron-rich foods, such as dark green, leafy vegetables. Heavy menstrual bleeding can cause low iron levels. ?Managing pain ?If directed, apply heat to your back or abdomen to reduce pain. Use the heat source that your health care provider recommends, such as a moist heat pack or a heating pad. To apply heat: ?Place a towel between your skin and the heat source. ?Leave the heat on for 20-30 minutes. ?Remove the heat if your skin turns bright red. This is especially important if you are unable to feel pain, heat, or cold. You may have a greater risk of getting burned. ? ?General instructions ?Pay close attention to your menstrual cycle. Tell your health care provider about any changes, such as: ?Heavier bleeding that requires you to change your pads or tampons more than usual. ?A change in the number of days that your menstrual period lasts. ?A change in symptoms that come with your menstrual period, such as back pain or cramps in your abdomen. ?Keep all follow-up visits. This is important, especially if your fibroids need to be monitored for any changes. ?Contact a health care provider if you: ?Have pelvic pain, back pain, or cramps in your abdomen that do not get better with medicine   or heat. ?Develop new bleeding between menstrual periods. ?Have increased bleeding during or between menstrual periods. ?Feel more tired or weak than usual. ?Feel light-headed. ?Get help right away if you: ?Faint. ?Have pelvic pain that suddenly  gets worse. ?Have severe vaginal bleeding that soaks a tampon or pad in 30 minutes or less. ?Summary ?Uterine fibroids are noncancerous (benign) tumors that can develop in the uterus. ?The exact cause of this condition is not known. ?Most fibroids do not require medical treatment unless they affect your ability to have children (fertility). ?Contact a health care provider if you have pelvic pain, back pain, or cramps in your abdomen that do not get better with medicines. ?Get help right away if you faint, have pelvic pain that suddenly gets worse, or have severe vaginal bleeding. ?This information is not intended to replace advice given to you by your health care provider. Make sure you discuss any questions you have with your health care provider. ?Document Revised: 06/26/2020 Document Reviewed: 06/26/2020 ?Elsevier Patient Education ? 2022 Elsevier Inc. ? ?

## 2021-06-30 ENCOUNTER — Encounter: Payer: Self-pay | Admitting: Family Medicine

## 2021-07-01 ENCOUNTER — Telehealth: Payer: Self-pay | Admitting: Family Medicine

## 2021-07-01 NOTE — Telephone Encounter (Signed)
Called for peer-to-peer. Approved AuthNF:[id number] Exp: 08/15/21

## 2021-07-01 NOTE — Telephone Encounter (Signed)
-----   Message from Rayann Heman sent at 07/01/2021 10:20 AM EDT ----- [place] [facility] care is requesting a peer to peer.   A Physician-to-Physician discussion is required to complete the Notification Process. The ordering provider must call [phone] and select option #3 to engage in a Physician-to-Physician discussion. Please ensure the physician has the case number provided when making this call.   Case Number [id number].  Thanks so much :)

## 2021-07-04 NOTE — Progress Notes (Signed)
Cardiology Office Note    Date:  07/05/2021   ID:  Marilyn Francis, DOB June 09, 1981, MRN [id number]  PCP:  Valerie Roys, DO  Cardiologist:  Nelva Bush, MD  Electrophysiologist:  None   Chief Complaint: Follow up  History of Present Illness:   Marilyn Francis is a 40 y.o. female with history of bipolar disorder, COVID infection in 04/2021, irritable bowel syndrome, and allergic rhinitis who presents for follow-up of recent GXT.  She was previously evaluated by pulmonology in 2017 for dyspnea.  Echo in 09/2016 showed an EF of 60 to 65%, normal wall motion, normal LV diastolic function parameters, mild mitral regurgitation, normal PASP, and normal RV systolic function.  Cardiopulmonary stress test at that time showed no indication of cardiopulmonary limitation or evidence of exercise-induced asthma.  She was evaluated by Dr. Saunders Revel as a new patient on 06/12/2021 for chest pain and recent hospitalization in [place], [place] with demand ischemia at the request of her PCP.  She presented to outside hospital in [place] after having recently developed sudden onset of left lower back pain feeling that something "burst."  This was followed by the sensation of feeling swollen all over, chest discomfort, fatigue, and short of breath.  Work-up in the hospital was notable for elevated high-sensitivity troponin I which trended down throughout her hospital stay with an initial value of 205.4 (upper limit of normal 51.4).  CTA of the chest was negative for PE.  Echo showed preserved LVEF with no regional wall motion abnormalities.  It was felt her elevated troponin reflected demand ischemia in the context of recent COVID infection in 04/2021.  Lower extremity ultrasound was negative for DVT.  On establishing with our group on 7/6, she reported feeling much better with almost complete resolution of her symptoms, though did continue to note some mild vague chest discomfort that was atypical.  EKG was normal.  She  did continue to have some low back pain, for which she had undergone CT of the abdomen and pelvis the day prior which demonstrated punctate renal stones without evidence of obstruction as well as uterine fibroids.  She was empirically started on PPI and ASA 81 mg was added.  She subsequently underwent GXT on 06/25/2021 which showed good exercise tolerance, normal blood pressure response, and no evidence of ischemic changes.  Overall, this was a negative adequate exercise tolerance test.  She comes in doing reasonably well from a cardiac perspective.  For the most part, her chest discomfort has improved when compared to what she was experiencing as outlined above.  She does continue to note some randomly occurring episodes of chest discomfort.  She also notes a longstanding history of dyspnea as well as palpitations, both of which are largely unchanged.  She is planning to undergo an MRI of her lower back for further evaluation of her back and leg pain.  She drinks 1 cup of coffee per day along with 1 beer.  No tobacco or illicit substances.  No family history of premature coronary disease.   Labs independently reviewed: 06/2021 - TSH normal, TC 192, TG 58, HDL 100, LDL 81, BUN 11, serum creatinine 0.93, potassium 4.4, albumin 4.7, AST/ALT normal, Hgb 13.5, PLT 210  Past Medical History:  Diagnosis Date   Allergic rhinitis    Bipolar 2 disorder ([place])     Past Surgical History:  Procedure Laterality Date   birth mark removal     DENTAL SURGERY      Current Medications: Current Meds  Medication Sig   Azelastine HCl 137 MCG/SPRAY SOLN SMARTSIG:1-2 Spray(s) Both Nares Twice Daily   Calcium Carbonate-Vit D-Min (CALCIUM 1200 PO) Take by mouth.   cetirizine (ZYRTEC) 10 MG tablet Take 10 mg by mouth daily.   cholecalciferol (VITAMIN D) 1000 units tablet Take 1,000 Units by mouth daily.   ibuprofen (ADVIL,MOTRIN) 600 MG tablet Take 600 mg by mouth every 6 (six) hours as needed.   Magnesium 200 MG  TABS Take 1 tablet by mouth daily.   Multiple Vitamin (MULTIVITAMIN) tablet Take 1 tablet by mouth daily.   NASACORT ALLERGY 24HR 55 MCG/ACT AERO nasal inhaler 2 sprays daily.   triamcinolone ointment (KENALOG) 0.5 % Apply 1 application topically 2 (two) times daily.    Allergies:   Patient has no known allergies.   Social History   Socioeconomic History   Marital status: Married    Spouse name: Not on file   Number of children: Not on file   Years of education: Not on file   Highest education level: Not on file  Occupational History   Not on file  Tobacco Use   Smoking status: Never   Smokeless tobacco: Never  Vaping Use   Vaping Use: Never used  Substance and Sexual Activity   Alcohol use: Yes    Alcohol/week: 10.0 standard drinks    Types: 10 Standard drinks or equivalent per week   Drug use: No   Sexual activity: Yes    Birth control/protection: None  Other Topics Concern   Not on file  Social History Narrative   Not on file   Social Determinants of Health   Financial Resource Strain: Not on file  Food Insecurity: Not on file  Transportation Needs: Not on file  Physical Activity: Not on file  Stress: Not on file  Social Connections: Not on file     Family History:  The patient's family history includes Breast cancer in her maternal grandmother; Cancer in her paternal grandfather; Heart disease in her paternal grandmother; Hypertension in her father and mother.  ROS:   Review of Systems  Constitutional:  Positive for malaise/fatigue. Negative for chills, diaphoresis, fever and weight loss.  HENT:  Negative for congestion.   Eyes:  Negative for discharge and redness.  Respiratory:  Positive for shortness of breath. Negative for cough, sputum production and wheezing.   Cardiovascular:  Positive for chest pain and palpitations. Negative for orthopnea, claudication, leg swelling and PND.  Gastrointestinal:  Negative for abdominal pain, heartburn, nausea and  vomiting.  Musculoskeletal:  Positive for back pain, joint pain and myalgias. Negative for falls.  Skin:  Negative for rash.  Neurological:  Negative for dizziness, tingling, tremors, sensory change, speech change, focal weakness, loss of consciousness and weakness.  Endo/Heme/Allergies:  Does not bruise/bleed easily.  Psychiatric/Behavioral:  Negative for substance abuse. The patient is not nervous/anxious.   All other systems reviewed and are negative.   EKGs/Labs/Other Studies Reviewed:    Studies reviewed were summarized above. The additional studies were reviewed today:  GXT 06/2021: Blood pressure demonstrated a normal response to exercise. There was no ST segment deviation noted during stress.   ETT with good exercise tolerance (11:30); no chest pain; normal blood pressure response; no ST changes; negative adequate exercise tolerance test. __________  2D echo [facility], [place], [place]) 05/2021: Normal LV size and wall thickness.  LVEF 55-6% with normal wall motion and diastolic function.  Normal RV size and function.  No significant valvular abnormality. __________  CPX  09/2016: Conclusion: Exercise testing with gas exchange demonstrates excellent functional capacity when compared to matched sedentary norms. There is no indication for cardiopulmonary limitation and no evidence of exercise-induced asthma.  __________  2D echo 09/2016: - Left ventricle: The cavity size was normal. Systolic function was    normal. The estimated ejection fraction was in the range of 60%    to 65%. Wall motion was normal; there were no regional wall    motion abnormalities. Left ventricular diastolic function    parameters were normal.  - Mitral valve: There was mild regurgitation.  - Left atrium: The atrium was normal in size.  - Right ventricle: Systolic function was normal.  - Pulmonary arteries: Systolic pressure was within the normal    range.   EKG:  EKG is not ordered today  given recent GXT.    Recent Labs: 06/11/2021: ALT 8; BUN 11; Creatinine, Ser 0.93; Hemoglobin 13.5; Platelets 210; Potassium 4.4; Sodium 139; TSH 2.620  Recent Lipid Panel    Component Value Date/Time   CHOL 192 06/11/2021 1454   TRIG 58 06/11/2021 1454   HDL 100 06/11/2021 1454   LDLCALC 81 06/11/2021 1454    PHYSICAL EXAM:    VS:  BP 126/80 (BP Location: Left Arm, Patient Position: Sitting, Cuff Size: Normal)   Pulse 78   Ht '5\' 3"'$  (1.6 m)   Wt 179 lb (81.2 kg)   LMP 06/17/2021   SpO2 98%   BMI 31.71 kg/m   BMI: Body mass index is 31.71 kg/m.  Physical Exam Vitals reviewed.  Constitutional:      Appearance: She is well-developed.  HENT:     Head: Normocephalic and atraumatic.  Eyes:     General:        Right eye: No discharge.        Left eye: No discharge.  Neck:     Vascular: No JVD.  Cardiovascular:     Rate and Rhythm: Normal rate and regular rhythm.     Pulses:          Dorsalis pedis pulses are 2+ on the right side and 2+ on the left side.       Posterior tibial pulses are 2+ on the right side and 2+ on the left side.     Heart sounds: Normal heart sounds, S1 normal and S2 normal. Heart sounds not distant. No midsystolic click and no opening snap. No murmur heard.   No friction rub.  Pulmonary:     Effort: Pulmonary effort is normal. No respiratory distress.     Breath sounds: Normal breath sounds. No decreased breath sounds, wheezing or rales.  Chest:     Chest wall: No tenderness.  Abdominal:     General: There is no distension.     Palpations: Abdomen is soft.     Tenderness: There is no abdominal tenderness.  Musculoskeletal:     Cervical back: Normal range of motion.     Right lower leg: No edema.     Left lower leg: No edema.  Skin:    General: Skin is warm and dry.     Nails: There is no clubbing.  Neurological:     Mental Status: She is alert and oriented to person, place, and time.  Psychiatric:        Speech: Speech normal.         Behavior: Behavior normal.        Thought Content: Thought content normal.  Judgment: Judgment normal.    Wt Readings from Last 3 Encounters:  07/05/21 179 lb (81.2 kg)  06/27/21 180 lb (81.6 kg)  06/26/21 180 lb (81.6 kg)     ASSESSMENT & PLAN:   Chest pain with elevated troponin and chronic dyspnea: Symptoms of chest discomfort are overall improved, though do continue to occur randomly.  Echo at outside hospital showed preserved LV systolic function with GXT in our office showing no evidence of ischemia.  Given continued chest pain with a history of elevated troponin and chronic dyspnea we will proceed with a cardiac MRI to exclude myocarditis or other potential infiltrative abnormalities.  At this time, further ischemic testing such as nuclear MPI, coronary CTA, or diagnostic R/LHC are deferred given normal GXT.  Disposition: F/u with Dr. Saunders Revel or an APP following cardiac MRI.   Medication Adjustments/Labs and Tests Ordered: Current medicines are reviewed at length with the patient today.  Concerns regarding medicines are outlined above. Medication changes, Labs and Tests ordered today are summarized above and listed in the Patient Instructions accessible in Encounters.   Signed, Christell Faith, PA-C 07/05/2021 1:12 PM     [place] [place] [place], [place] [address] [phone]

## 2021-07-05 ENCOUNTER — Ambulatory Visit (INDEPENDENT_AMBULATORY_CARE_PROVIDER_SITE_OTHER): Payer: 59 | Admitting: Physician Assistant

## 2021-07-05 ENCOUNTER — Other Ambulatory Visit: Payer: Self-pay

## 2021-07-05 ENCOUNTER — Encounter: Payer: Self-pay | Admitting: Physician Assistant

## 2021-07-05 VITALS — BP 126/80 | HR 78 | Ht 63.0 in | Wt 179.0 lb

## 2021-07-05 DIAGNOSIS — R0609 Other forms of dyspnea: Secondary | ICD-10-CM | POA: Diagnosis not present

## 2021-07-05 DIAGNOSIS — R072 Precordial pain: Secondary | ICD-10-CM | POA: Diagnosis not present

## 2021-07-05 DIAGNOSIS — R778 Other specified abnormalities of plasma proteins: Secondary | ICD-10-CM

## 2021-07-05 NOTE — Patient Instructions (Addendum)
Medication Instructions:  No changes at this time.  *If you need a refill on your cardiac medications before your next appointment, please call your pharmacy*   Lab Work: CBC with H&H  If you have labs (blood work) drawn today and your tests are completely normal, you will receive your results only by: [place] (if you have MyChart) OR A paper copy in the mail If you have any lab test that is abnormal or we need to change your treatment, we will call you to review the results.   Testing/Procedures:  You are scheduled for Cardiac MRI on _______. Please arrive at the [facility] main entrance of [facility] at ______ (30-45 minutes prior to test start time). ?   [facility]  [address]  [place], [place] [address]  [phone]  Proceed to the [facility] Radiology Department (First Floor).  ?  Magnetic resonance imaging (MRI) is a painless test that produces images of the inside of the body without using X-rays. During an MRI, strong magnets and radio waves work together in a Research officer, political party to form detailed images. MRI images may provide more details about a medical condition than X-rays, CT scans, and ultrasounds can provide.   You may be given earphones to listen for instructions.  You may eat a light breakfast and take medications as ordered.   If a contrast material will be used, an IV will be inserted into one of your veins. Contrast material will be injected into your IV.  You will be asked to remove all metal, including: Watch, jewelry, and other metal objects including hearing aids, hair pieces and dentures. (Braces and fillings normally are not a problem.)  If contrast material was used:  It will leave your body through your urine within a day. You may be told to drink plenty of fluids to help flush the contrast material out of your system.  TEST WILL TAKE APPROXIMATELY 1 HOUR  PLEASE NOTIFY SCHEDULING AT LEAST 24 HOURS IN ADVANCE IF YOU ARE  UNABLE TO KEEP YOUR APPOINTMENT.    For non-scheduling related questions, please contact the cardiac imaging nurse navigator should you have any questions/concerns: Marchia Bond, Cardiac Imaging Nurse Navigator Gordy Clement, Cardiac Imaging Nurse Navigator [facility] Direct Office Dial: [phone]   For scheduling needs, including cancellations and rescheduling, please call [place], [phone].  Follow-Up: At [facility], you and your health needs are our priority.  As part of our continuing mission to provide you with exceptional heart care, we have created designated Provider Care Teams.  These Care Teams include your primary Cardiologist (physician) and Advanced Practice Providers (APPs -  Physician Assistants and Nurse Practitioners) who all work together to provide you with the care you need, when you need it.   Your next appointment:   Follow up after testing  The format for your next appointment:   In Person  Provider:   Nelva Bush, MD or Christell Faith, PA-C

## 2021-07-09 ENCOUNTER — Ambulatory Visit
Admission: RE | Admit: 2021-07-09 | Discharge: 2021-07-09 | Disposition: A | Discharge status: Home / Self Care | Payer: 59 | Source: Ambulatory Visit | Attending: Family Medicine | Admitting: Family Medicine

## 2021-07-09 ENCOUNTER — Other Ambulatory Visit: Payer: Self-pay

## 2021-07-09 DIAGNOSIS — M5416 Radiculopathy, lumbar region: Secondary | ICD-10-CM | POA: Diagnosis present

## 2021-07-09 DIAGNOSIS — M791 Myalgia, unspecified site: Secondary | ICD-10-CM | POA: Insufficient documentation

## 2021-07-09 IMAGING — MR MR LUMBAR SPINE W/O CM
5 series · 31 of 48 positions shown · non-contrast
Comparison: CT scan [DATE]

CLINICAL DATA: Low back pain worsening over the last 10 months
especially over the last 4 weeks.

EXAM:
MRI LUMBAR SPINE WITHOUT CONTRAST
TECHNIQUE: Multiplanar, multisequence MR imaging of the lumbar spine was
performed. No intravenous contrast was administered.

[Series 5: T2 · sagittal · 4.0mm · 0.81mm/px · 6 of 17 slices shown (1 of 2)]
[im 1/17]
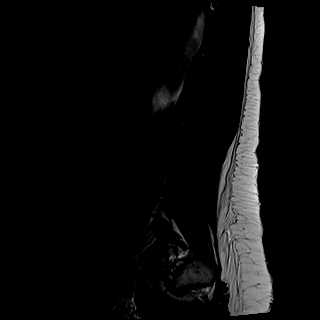
[im 4/17]
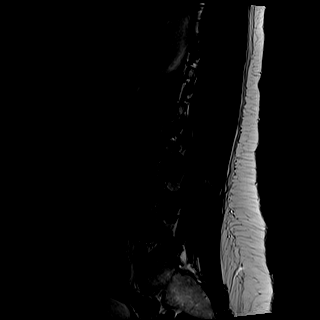
[im 7/17]
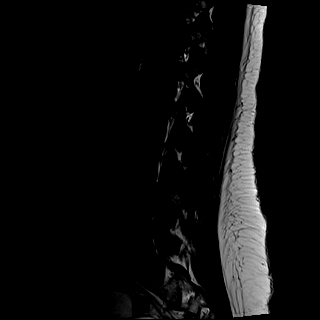
[im 10/17]
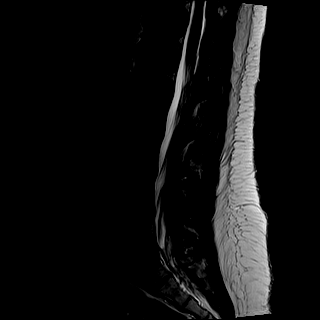
[im 13/17]
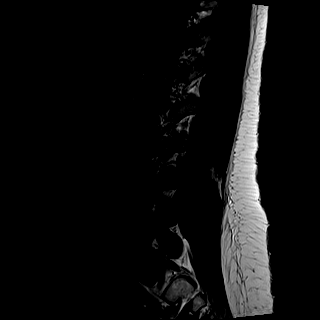
[im 17/17]
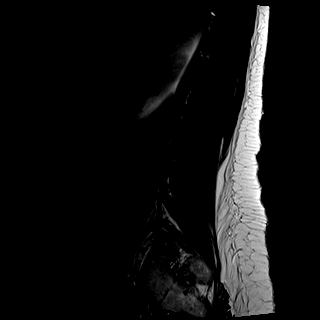

[Series 6: T1 · sagittal · 4.0mm · 0.81mm/px · 7 of 17 slices shown (1 of 2)]
[im 1/17]
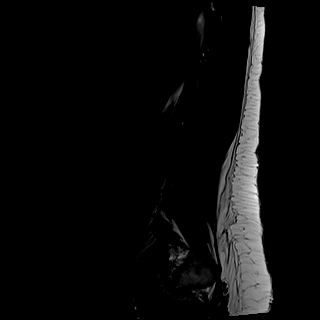
[im 3/17]
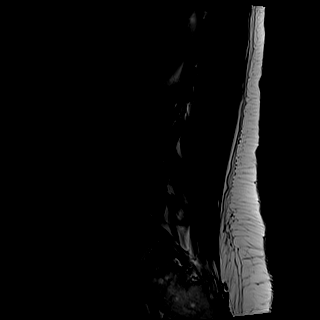
[im 6/17]
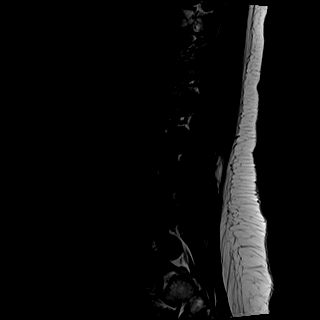
[im 9/17]
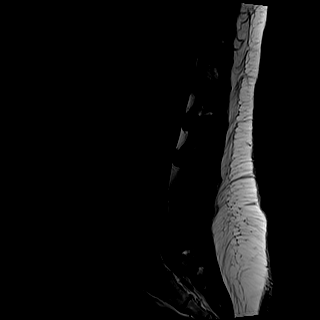
[im 11/17]
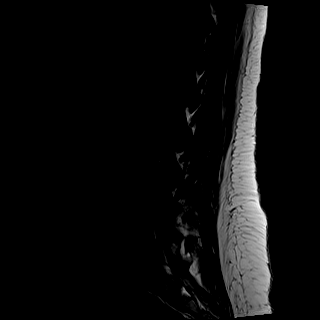
[im 14/17]
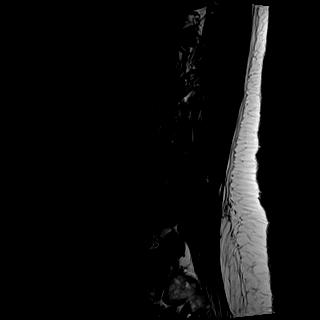
[im 17/17]
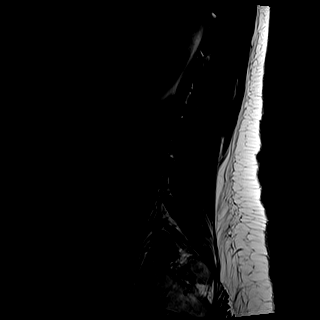

[Series 7: STIR · sagittal · 4.0mm · 0.41mm/px · 2 of 17 slices shown]
[im 1/17]
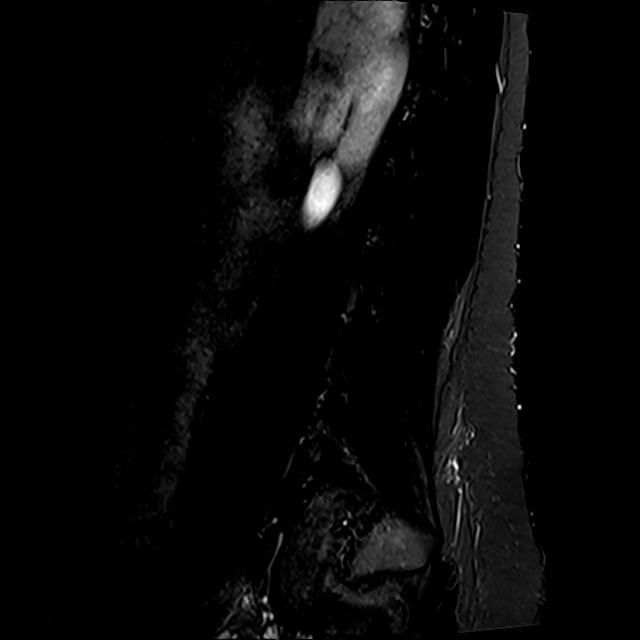
[im 3/17]
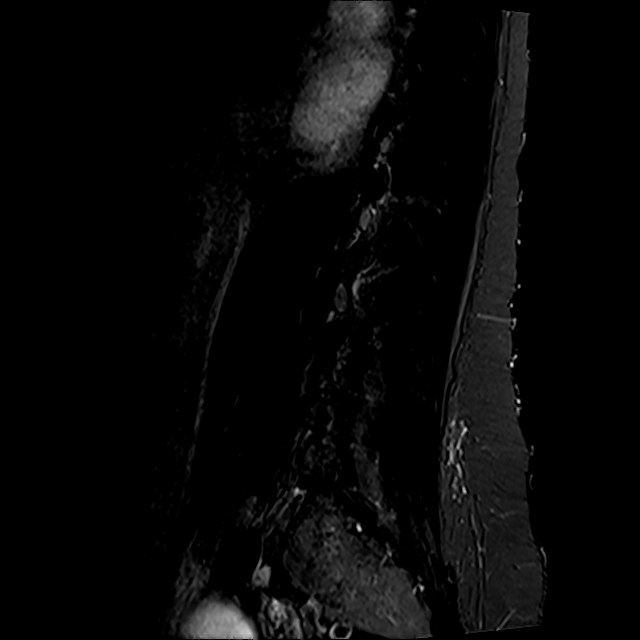

[Series 8: T2 · axial · 4.0mm · 0.78mm/px · z∈[-106,+100]mm · 8 of 36 slices shown (2 of 2)]
[im 1/36]
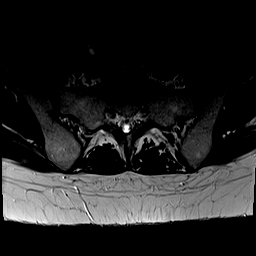
[im 6/36]
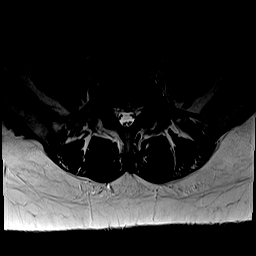
[im 11/36]
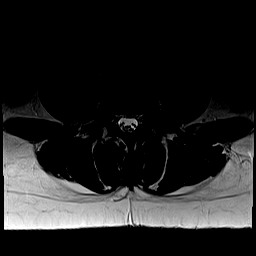
[im 17/36]
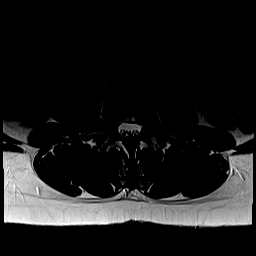
[im 19/36]
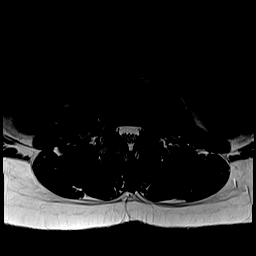
[im 25/36]
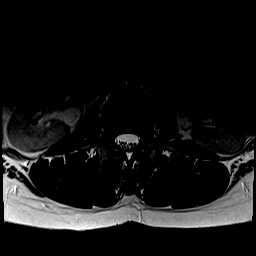
[im 30/36]
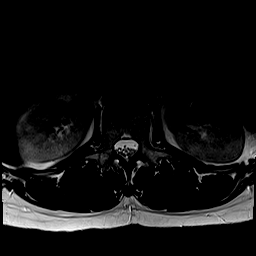
[im 36/36]
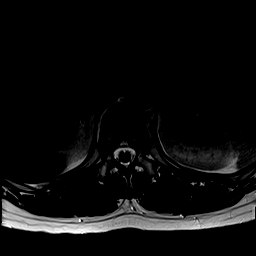

[Series 9: T1 · axial · 4.0mm · 0.39mm/px · z∈[-106,+100]mm · 8 of 36 slices shown (2 of 2)]
[im 1/36]
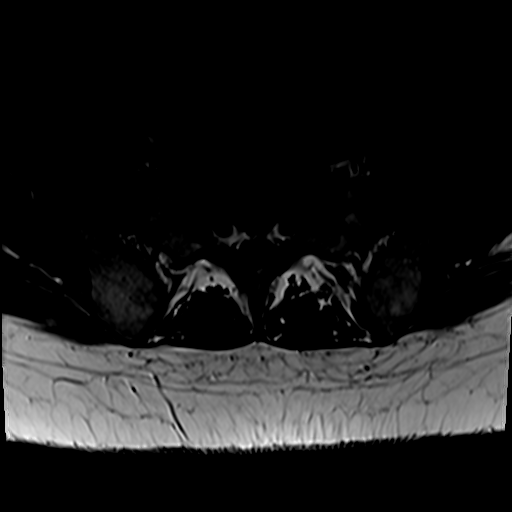
[im 6/36]
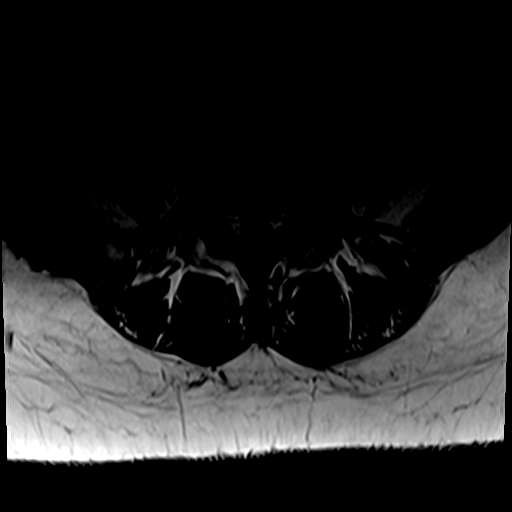
[im 11/36]
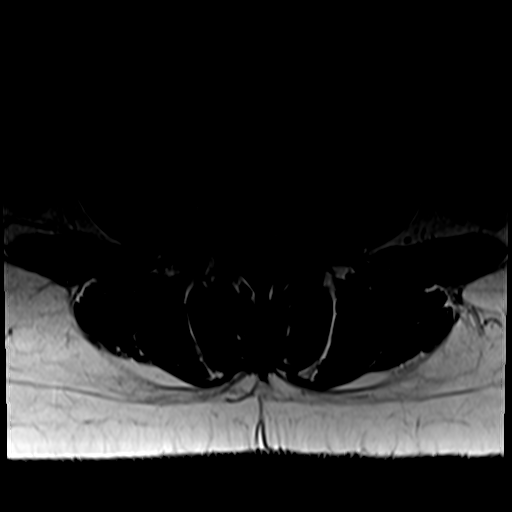
[im 17/36]
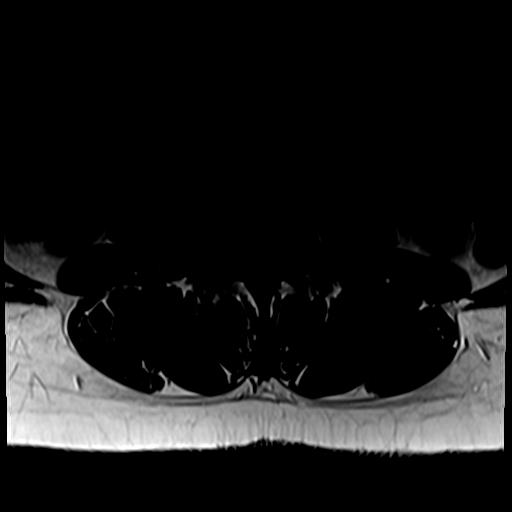
[im 19/36]
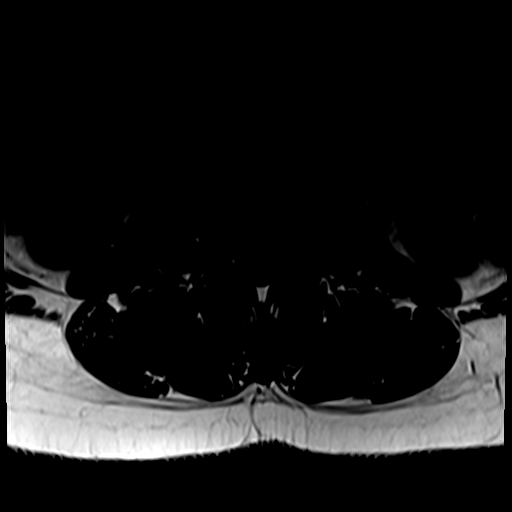
[im 25/36]
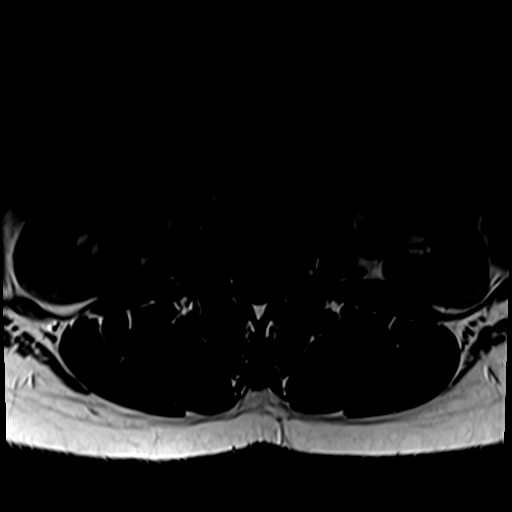
[im 30/36]
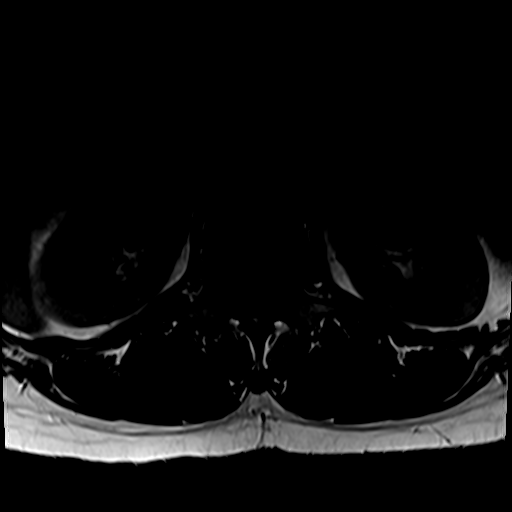
[im 36/36]
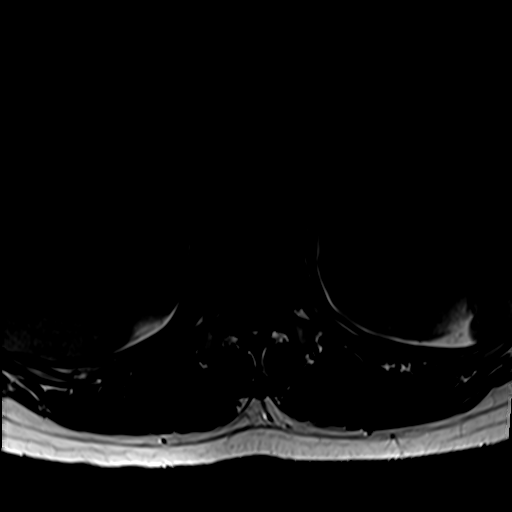

[31 of 48 positions shown; findings below may reference images not displayed]

FINDINGS: Segmentation: The lowest lumbar type non-rib-bearing vertebra is
labeled as L5.

Alignment:  No vertebral subluxation is observed.

Vertebrae: The disc desiccation at L4-5 and L5-S1. No significant
vertebral marrow edema is identified.

Conus medullaris and cauda equina: Conus extends to the L1 level.
Conus and cauda equina appear normal.

Paraspinal and other soft tissues: Fluid signal intensity structure
in the vicinity of the left adnexa up to 4.0 cm in diameter, not
well shown on prior CT scan of [DATE], possibly a ovarian or
adnexal cyst, without complex elements in the visualized portion.

Disc levels:

L1-2: Unremarkable.

L2-3: Unremarkable.

L3-4: No impingement.  Mild disc bulge.

L4-5: Borderline left subarticular lateral recess stenosis due to
disc bulge. Small central annular tear.

L5-S1: No impingement.  Small central disc protrusion.
IMPRESSION: 1. Degenerative disc disease in the lower lumbar spine but without
overt impingement identified.
2. We partially image a fluid signal intensity structure in the
vicinity of the left adnexa up to 4.0 cm in diameter, but not well
seen on prior CT scan from [DATE]. This is possibly an ovarian
or adnexal cyst. No complex elements in the visualized portion. If
the patient has pelvic pain eccentric to the left then this could be
further assessed with pelvic sonography.

## 2021-07-10 LAB — RA QN+CCP(IGG/A)+SJOSSA+SJOSSB
Cyclic Citrullin Peptide Ab: 6 units (ref 0–19)
ENA SSA (RO) Ab: <0.2 AI (ref 0.0–0.9)
ENA SSB (LA) Ab: <0.2 AI (ref 0.0–0.9)
Rheumatoid fact SerPl-aCnc: <10 IU/mL (ref <14.0)

## 2021-07-10 LAB — ROCKY MTN SPOTTED FVR ABS PNL(IGG+IGM)
RMSF IgG: NEGATIVE
RMSF IgM: 0.35 index (ref 0.00–0.89)

## 2021-07-10 LAB — SEDIMENTATION RATE: Sed Rate: 2 mm/hr (ref 0–32)

## 2021-07-10 LAB — ANA: Anti Nuclear Antibody (ANA): NEGATIVE

## 2021-07-10 LAB — BABESIA MICROTI ANTIBODY PANEL
Babesia microti IgG: <1:10 {titer}
Babesia microti IgM: <1:10 {titer}

## 2021-07-10 LAB — VITAMIN B12: Vitamin B-12: 649 pg/mL (ref 232–1245)

## 2021-07-10 LAB — VITAMIN B1: Thiamine: 188.3 nmol/L (ref 66.5–200.0)

## 2021-07-10 LAB — EHRLICHIA ANTIBODY PANEL
E. Chaffeensis (HME) IgM Titer: NEGATIVE
E.Chaffeensis (HME) IgG: NEGATIVE
HGE IgG Titer: NEGATIVE
HGE IgM Titer: NEGATIVE

## 2021-07-10 LAB — CK: Total CK: 68 U/L (ref 32–182)

## 2021-07-10 LAB — VITAMIN D 25 HYDROXY (VIT D DEFICIENCY, FRACTURES): Vit D, 25-Hydroxy: 48.7 ng/mL (ref 30.0–100.0)

## 2021-07-10 LAB — FOLATE: Folate: >20 ng/mL (ref >3.0)

## 2021-07-22 ENCOUNTER — Other Ambulatory Visit: Payer: Self-pay | Admitting: Family Medicine

## 2021-07-22 DIAGNOSIS — M5416 Radiculopathy, lumbar region: Secondary | ICD-10-CM

## 2021-07-23 ENCOUNTER — Telehealth: Payer: Self-pay

## 2021-07-23 NOTE — Telephone Encounter (Signed)
Copied from [place] [phone]. Topic: Referral - Medical Records >> Jul 23, 2021  8:46 AM Loma Boston wrote:  Re a Referral yesterday to   [id number] With  [facility]  calling needing all notes to support pain management, Fax over [phone] Elpidio Anis   Questions she is reachable at  [phone]   Routing to referral coordinator.

## 2021-08-02 ENCOUNTER — Other Ambulatory Visit (HOSPITAL_COMMUNITY)
Admission: RE | Admit: 2021-08-02 | Discharge: 2021-08-02 | Disposition: A | Discharge status: Home / Self Care | Payer: 59 | Source: Ambulatory Visit | Attending: Obstetrics & Gynecology | Admitting: Obstetrics & Gynecology

## 2021-08-02 ENCOUNTER — Encounter: Payer: Self-pay | Admitting: Obstetrics & Gynecology

## 2021-08-02 ENCOUNTER — Ambulatory Visit (INDEPENDENT_AMBULATORY_CARE_PROVIDER_SITE_OTHER): Payer: 59 | Admitting: Obstetrics & Gynecology

## 2021-08-02 ENCOUNTER — Other Ambulatory Visit: Payer: Self-pay

## 2021-08-02 VITALS — BP 120/80 | Ht 63.0 in | Wt 182.0 lb

## 2021-08-02 DIAGNOSIS — Z124 Encounter for screening for malignant neoplasm of cervix: Secondary | ICD-10-CM

## 2021-08-02 DIAGNOSIS — R1032 Left lower quadrant pain: Secondary | ICD-10-CM | POA: Diagnosis not present

## 2021-08-02 NOTE — Progress Notes (Signed)
HPI: Patient is a 40 y.o. WF w c/o pain.   Patient's last menstrual period was 07/11/2021., presents today for a problem visit.  She complains of recent findings of Left ovarion cyst by MRI - Pelvis (done for LBP and investigation overall).  Prior CT in June did not reveal cyst, just 2 cm fibroid.  Has not had [place].  Pt has had symptoms of  LLQ pain for 2 mos, occas radiation to hip but no other associated sx's or findings, no modifiers.  No prior surgery. No h/o endometriosis.    No hormones.  PMHx: She  has a past medical history of Allergic rhinitis and Bipolar 2 disorder ([place]). Also,  has a past surgical history that includes birth mark removal and Dental surgery., family history includes Breast cancer in her maternal grandmother; Cancer in her paternal grandfather; Heart disease in her paternal grandmother; Hypertension in her father and mother.,  reports that she has never smoked. She has never used smokeless tobacco. She reports current alcohol use of about 10.0 standard drinks per week. She reports that she does not use drugs.  She has a current medication list which includes the following prescription(s): azelastine hcl, calcium carbonate-vit d-min, cetirizine, cholecalciferol, ibuprofen, magnesium, multivitamin, nasacort allergy 24hr, and triamcinolone ointment. Also, has No Known Allergies.  Review of Systems  Constitutional:  Negative for chills, fever and malaise/fatigue.  HENT:  Negative for congestion, sinus pain and sore throat.   Eyes:  Negative for blurred vision and pain.  Respiratory:  Negative for cough and wheezing.   Cardiovascular:  Negative for chest pain and leg swelling.  Gastrointestinal:  Positive for abdominal pain. Negative for constipation, diarrhea, heartburn, nausea and vomiting.  Genitourinary:  Negative for dysuria, frequency, hematuria and urgency.  Musculoskeletal:  Negative for back pain, joint pain, myalgias and neck pain.  Skin:  Negative for itching and  rash.  Neurological:  Negative for dizziness, tremors and weakness.  Endo/Heme/Allergies:  Does not bruise/bleed easily.  Psychiatric/Behavioral:  Negative for depression. The patient is not nervous/anxious and does not have insomnia.    Objective: BP 120/80   Ht '5\' 3"'$  (1.6 m)   Wt 182 lb (82.6 kg)   LMP 07/11/2021   BMI 32.24 kg/m  Physical Exam Constitutional:      General: She is not in acute distress.    Appearance: She is well-developed.  Genitourinary:     Right Labia: No rash or tenderness.    Left Labia: No tenderness or rash.    No vaginal erythema or bleeding.      Right Adnexa: not tender and no mass present.    Left Adnexa: not tender and no mass present.    No cervical motion tenderness, discharge, polyp or nabothian cyst.     Uterus is not enlarged.     No uterine mass detected.    Pelvic exam was performed with patient in the lithotomy position.  HENT:     Head: Normocephalic and atraumatic.     Nose: Nose normal.  Abdominal:     General: There is no distension.     Palpations: Abdomen is soft.     Tenderness: There is no abdominal tenderness.  Musculoskeletal:        General: Normal range of motion.  Neurological:     Mental Status: She is alert and oriented to person, place, and time.     Cranial Nerves: No cranial nerve deficit.  Skin:    General: Skin is warm and dry.  Psychiatric:        Attention and Perception: Attention normal.        Mood and Affect: Mood and affect normal.        Speech: Speech normal.        Behavior: Behavior normal.        Thought Content: Thought content normal.        Judgment: Judgment normal.    ASSESSMENT/PLAN:    Problem List Items Addressed This Visit     LLQ pain    -  Primary   [place] to assess for cyst May be incidental finding as pain has been present for 2 mos and no cyst by CT in June. Understandable that it could indeed be the cause, also the fibroid. CPP discussed as well, w nerves, muscles, hernia, GI,  other etiology. Will discuss more after [place]   PAP done  Barnett Applebaum, MD, [facility], [facility] 08/02/2021  4:38 PM

## 2021-08-02 NOTE — Patient Instructions (Signed)
Transvaginal Ultrasound A transvaginal ultrasound, also called an endovaginal ultrasound, is a test that uses sound waves to take pictures of the female genital tract. The pictures are taken with a device, called a transducer, that is placed in thevagina. This test may be done to: Check for problems with your pregnancy. Check your developing baby. Check for anything abnormal in the uterus or ovaries. Find out why you have pelvic pain or bleeding. Tell a health care provider about: Any allergies you have. All medicines you are taking, including vitamins, herbs, eye drops, creams, and over-the-counter medicines. Any blood disorders you have. Any surgeries you have had. Any medical conditions you have. Whether you are pregnant or may be pregnant. Whether you are having your menstrual period. What are the risks? This is a safe procedure. There are no known risks or complications of havingthis test. What happens before the procedure? This procedure needs to be done when your bladder is empty. Follow your health care provider's instructions about drinking fluids and emptying your bladderbefore the test. What happens during the procedure?  You will empty your bladder before the procedure. You will undress from the waist down. You will lie down on an exam table, with your knees bent and your feet in foot holders. A health care provider will cover the transducer with a sterile cover. A gel will be put on the transducer. The gel helps transmit the sound waves and prevents irritation of your vagina. The technician will insert the transducer into your vagina to get images. These will be displayed on a monitor that looks like a small television screen. The transducer will be removed when the procedure is complete. The procedure may vary among health care providers and hospitals. What happens after the procedure? It is up to you to get the results of your procedure. Ask your health care provider, or  the department that is doing the procedure, when your results will be ready. Keep all follow-up visits as told by your health care provider. This is important. Summary A transvaginal ultrasound, also called an endovaginal ultrasound, is a test that uses sound waves to take pictures of the female genital tract. This is a safe procedure. There are no known risks associated with this test. The procedure needs to be done when your bladder is empty. Follow your health care provider's instructions about drinking fluids and emptying your bladder before the test. During the procedure, you will undress from the waist down and lie down on an exam table. A technician will insert a transducer into your vagina to obtain images. Ask your health care provider, or the department that is doing the procedure, when your results will be ready. This information is not intended to replace advice given to you by your health care provider. Make sure you discuss any questions you have with your healthcare provider. Document Revised: 11/06/2020 Document Reviewed: 07/07/2018 Elsevier Patient Education  2022 [employer].

## 2021-08-06 LAB — CYTOLOGY - PAP
Comment: NEGATIVE
Diagnosis: NEGATIVE
High risk HPV: NEGATIVE

## 2021-08-14 ENCOUNTER — Telehealth (HOSPITAL_COMMUNITY): Payer: Self-pay | Admitting: Emergency Medicine

## 2021-08-14 NOTE — Telephone Encounter (Signed)
Reaching out to patient to offer assistance regarding upcoming cardiac imaging study; pt verbalizes understanding of appt date/time, parking situation and where to check in, pre-test NPO status and medications ordered, and verified current allergies; name and call back number provided for further questions should they arise Marchia Bond RN [facility] [phone] office [phone] cell   Denies implants Denies claustro Denies iv issues

## 2021-08-16 ENCOUNTER — Other Ambulatory Visit: Payer: Self-pay | Admitting: Physician Assistant

## 2021-08-16 ENCOUNTER — Ambulatory Visit (INDEPENDENT_AMBULATORY_CARE_PROVIDER_SITE_OTHER): Payer: 59 | Admitting: Family Medicine

## 2021-08-16 ENCOUNTER — Encounter: Payer: Self-pay | Admitting: Family Medicine

## 2021-08-16 ENCOUNTER — Ambulatory Visit (HOSPITAL_COMMUNITY)
Admission: RE | Admit: 2021-08-16 | Discharge: 2021-08-16 | Disposition: A | Discharge status: Home / Self Care | Payer: 59 | Source: Ambulatory Visit | Attending: Physician Assistant | Admitting: Physician Assistant

## 2021-08-16 ENCOUNTER — Telehealth: Payer: Self-pay | Admitting: Obstetrics & Gynecology

## 2021-08-16 ENCOUNTER — Telehealth: Payer: Self-pay

## 2021-08-16 ENCOUNTER — Other Ambulatory Visit: Payer: Self-pay

## 2021-08-16 ENCOUNTER — Ambulatory Visit: Payer: 59 | Admitting: Family Medicine

## 2021-08-16 VITALS — BP 113/73 | HR 72 | Temp 98.6°F | Wt 185.6 lb

## 2021-08-16 DIAGNOSIS — R072 Precordial pain: Secondary | ICD-10-CM

## 2021-08-16 DIAGNOSIS — M5416 Radiculopathy, lumbar region: Secondary | ICD-10-CM | POA: Diagnosis not present

## 2021-08-16 IMAGING — MR MR CARD MORPHOLOGY WO/W CM
45 of 48 series · 45 of 48 positions shown · IV contrast (Contrast agent)
Comparison: none

CLINICAL DATA: Myocarditis

EXAM:
CARDIAC MRI
TECHNIQUE: The patient was scanned on a 1.5 Tesla Siemens magnet. A dedicated
cardiac coil was used. Functional imaging was done using Fiesta
sequences. [DATE], and 4 chamber views were done to assess for RWMA's.
Modified TOURYALAI rule using a short axis stack was used to
calculate an ejection fraction on a dedicated work station using
Circle software. The patient received 8 cc of Gadavist . After 10
minutes inversion recovery sequences were used to assess for
infiltration and scar tissue.
CONTRAST:  Gadavist

[Series 4: [id number]_bh · axial · 8.0mm · 1.41mm/px · 1 of 16 slices shown]
[im 1/16]
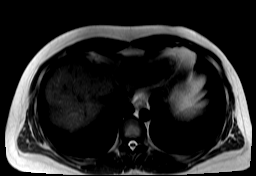

[Series 8: bSSFP · oblique · 8.0mm · 1.61mm/px · 1 of 25 slices shown (1 of 20)]
[im 1/25]
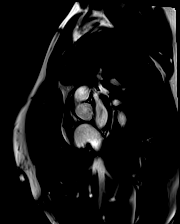

[Series 9: bSSFP · oblique · 8.0mm · 1.61mm/px · 1 of 25 slices shown (2 of 20)]
[im 1/25]
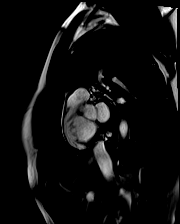

[Series 10: bSSFP · oblique · 8.0mm · 1.61mm/px · 1 of 25 slices shown (3 of 20)]
[im 1/25]
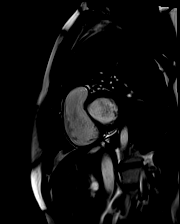

[Series 11: bSSFP · oblique · 8.0mm · 1.61mm/px · 1 of 25 slices shown (4 of 20)]
[im 1/25]
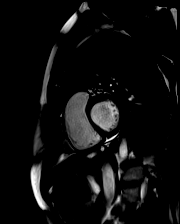

[Series 12: bSSFP · oblique · 8.0mm · 1.61mm/px · 1 of 25 slices shown (5 of 20)]
[im 1/25]
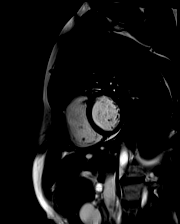

[Series 13: bSSFP · oblique · 8.0mm · 1.61mm/px · 1 of 25 slices shown (6 of 20)]
[im 1/25]
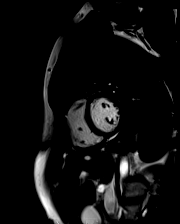

[Series 14: bSSFP · oblique · 8.0mm · 1.61mm/px · 1 of 25 slices shown (7 of 20)]
[im 1/25]
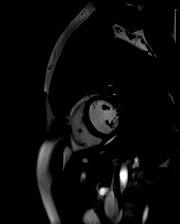

[Series 15: bSSFP · oblique · 8.0mm · 1.61mm/px · 1 of 25 slices shown (8 of 20)]
[im 1/25]
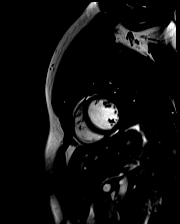

[Series 16: bSSFP · oblique · 8.0mm · 1.61mm/px · 1 of 25 slices shown (9 of 20)]
[im 1/25]
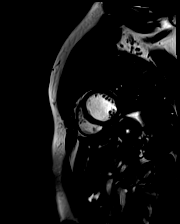

[Series 17: bSSFP · oblique · 8.0mm · 1.61mm/px · 1 of 25 slices shown (10 of 20)]
[im 1/25]
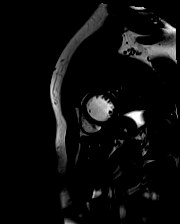

[Series 18: bSSFP · oblique · 8.0mm · 1.61mm/px · 1 of 25 slices shown (11 of 20)]
[im 1/25]
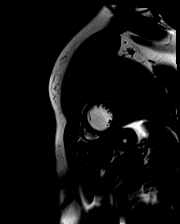

[Series 19: bSSFP · oblique · 8.0mm · 1.61mm/px · 1 of 25 slices shown (12 of 20)]
[im 1/25]
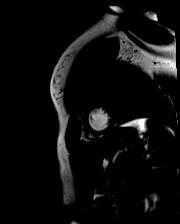

[Series 20: bSSFP · oblique · 8.0mm · 1.61mm/px · 1 of 25 slices shown (13 of 20)]
[im 1/25]
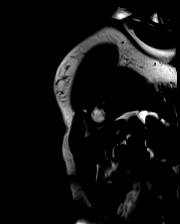

[Series 21: bSSFP · oblique · 8.0mm · 1.61mm/px · 1 of 25 slices shown (14 of 20)]
[im 1/25]
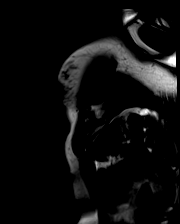

[Series 22: bSSFP · oblique · 8.0mm · 1.61mm/px · 1 of 25 slices shown (15 of 20)]
[im 1/25]
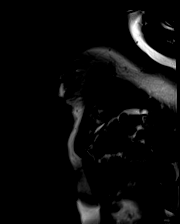

[Series 23: STIR · oblique · 8.0mm · 1.92mm/px · 1 of 15 slices shown]
[im 1/15]
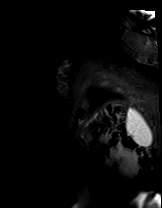

[Series 24: bSSFP · axial · 6.0mm · 1.41mm/px · 1 of 25 slices shown (16 of 20)]
[im 1/25]
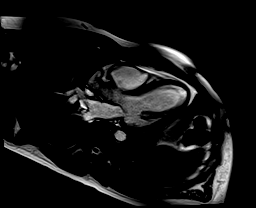

[Series 25: bSSFP · oblique · 6.0mm · 1.41mm/px · 1 of 25 slices shown (17 of 20)]
[im 1/25]
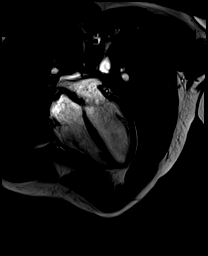

[Series 26: bSSFP · oblique · 6.0mm · 1.41mm/px · 1 of 25 slices shown (18 of 20)]
[im 1/25]
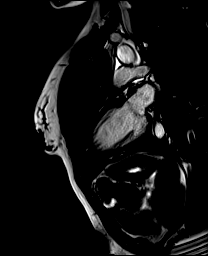

[Series 27: (id)_long_t1 · oblique · 8.0mm · 1.56mm/px · 1 of 24 slices shown]
[im 1/24]
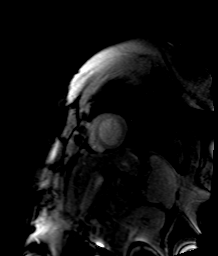

[Series 28: (id)_long_t1_moco · oblique · 8.0mm · 1.56mm/px · 1 of 24 slices shown]
[im 1/24]
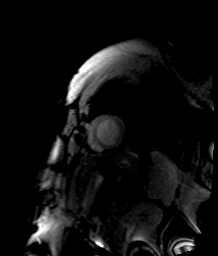

[Series 29: (id)_long_t1_moco_t1 · oblique · 8.0mm · 1.56mm/px · 1 of 6 slices shown]
[im 1/6]
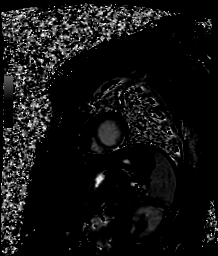

[Series 31: (id)_trufi · oblique · 8.0mm · 2.08mm/px · 1 of 9 slices shown]
[im 1/9]
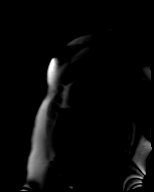

[Series 32: (id)_trufi_moco · oblique · 8.0mm · 2.08mm/px · 1 of 9 slices shown]
[im 1/9]
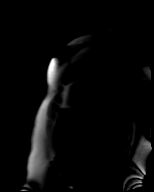

[Series 33: (id)_trufi_moco_t2 · oblique · 8.0mm · 2.08mm/px · 1 of 3 slices shown]
[im 1/3]
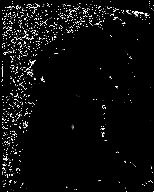

[Series 35: bSSFP · coronal · 6.0mm · 1.41mm/px · 1 of 25 slices shown (19 of 20)]
[im 1/25]
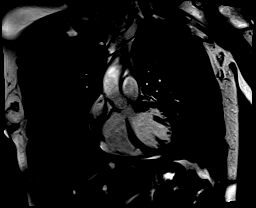

[Series 36: aortic valve cine · oblique · 6.0mm · 1.41mm/px · 1 of 25 slices shown]
[im 1/25]
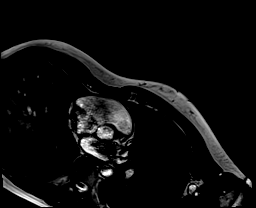

[Series 37: pre short axis · oblique · non-contrast · 8.0mm · 2.25mm/px · 1 of 10 slices shown (1 of 6)]
[im 1/10]
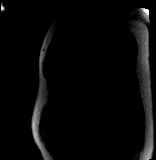

[Series 38: pre short axis · oblique · non-contrast · 8.0mm · 2.25mm/px · 1 of 10 slices shown (2 of 6)]
[im 1/10]
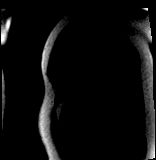

[Series 39: pre short axis · oblique · non-contrast · 8.0mm · 2.25mm/px · 1 of 10 slices shown (3 of 6)]
[im 1/10]
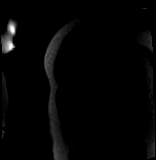

[Series 40: pre short axis · oblique · non-contrast · 8.0mm · 2.25mm/px · 1 of 10 slices shown (4 of 6)]
[im 1/10]
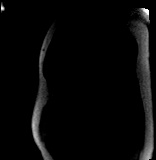

[Series 41: pre short axis · oblique · non-contrast · 8.0mm · 2.25mm/px · 1 of 10 slices shown (5 of 6)]
[im 1/10]
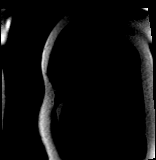

[Series 42: pre short axis · oblique · non-contrast · 8.0mm · 2.25mm/px · 1 of 10 slices shown (6 of 6)]
[im 1/10]
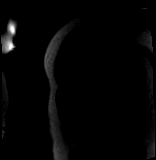

[Series 43: bSSFP · oblique · 6.0mm · 1.41mm/px · 1 of 25 slices shown (20 of 20)]
[im 1/25]
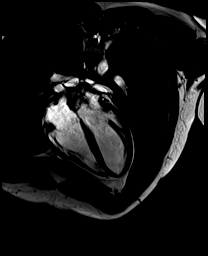

[Series 44: rest short axis · oblique · 8.0mm · 2.25mm/px · 1 of 60 slices shown (1 of 6)]
[im 1/60]
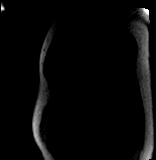

[Series 45: rest short axis · oblique · 8.0mm · 2.25mm/px · 1 of 60 slices shown (2 of 6)]
[im 1/60]
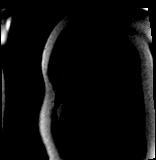

[Series 46: rest short axis · oblique · 8.0mm · 2.25mm/px · 1 of 60 slices shown (3 of 6)]
[im 1/60]
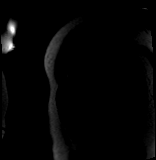

[Series 47: rest short axis · oblique · 8.0mm · 2.25mm/px · 1 of 60 slices shown (4 of 6)]
[im 1/60]
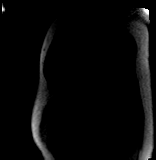

[Series 48: rest short axis · oblique · 8.0mm · 2.25mm/px · 1 of 60 slices shown (5 of 6)]
[im 1/60]
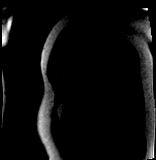

[Series 49: rest short axis · oblique · 8.0mm · 2.25mm/px · 1 of 60 slices shown (6 of 6)]
[im 1/60]
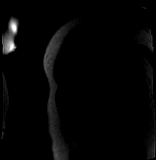

[Series 50: cine rvit · coronal · 6.0mm · 1.41mm/px · 1 of 25 slices shown]
[im 1/25]
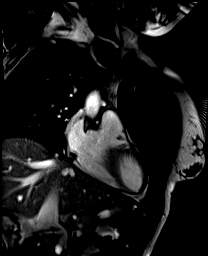

[Series 51: cine rvot · sagittal · 6.0mm · 1.41mm/px · 1 of 25 slices shown]
[im 1/25]
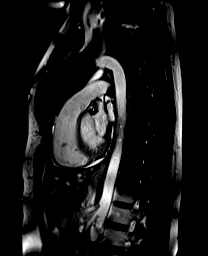

[Series 53: lge_single shot sa · sagittal · 8.0mm · 2.08mm/px · 1 of 18 slices shown (1 of 2)]
[im 1/18]
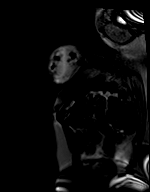

[Series 54: lge_single shot sa · sagittal · 8.0mm · 2.08mm/px · 1 of 18 slices shown (2 of 2)]
[im 1/18]
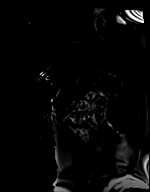

[45 of 48 positions shown; findings below may reference images not displayed]

FINDINGS: Normal atrial sizes. No PFA/ASD. No pericardia effusion Normal
aortic root 3.0 cm Normal tri cuspid AV. Normal MV and TV Normal PV
and RVOT. Normal RV size and function. Normal LV size and function.
Quantitative EF 64% (EDV 156 cc ESV 56 cc SV 100 cc) Normal T1 [GW]
msec. Calculated ECV elevated 52 but large confidence range of 62%
T2 normal 51 msec. Delayed enhancement images post gadolinium with
no uptake
IMPRESSION: 1. Normal LV size and function EF 62%

2. Normal T1/T2 with elevated ECV 52 large confidence interval not
likely significant

3. No delayed gadolinium uptake to suggest myocarditis on delayed
inversion recovery images

4.  Normal valves

5.  Normal aortic root 3.0 cm

6.  No pericardial effusion

7.  Normal RV size and function

TOURYALAI

## 2021-08-16 MED ORDER — GADOBUTROL 1 MMOL/ML IV SOLN
8.0000 mL | Freq: Once | INTRAVENOUS | Status: AC | PRN
  Administered 2021-08-16: 8 mL via INTRAVENOUS

## 2021-08-16 NOTE — Telephone Encounter (Signed)
Received a call from [facility] [phone]) informing us that the order for the patients cardiac MRI was entered wrong. Thurmond Butts gave a verbal order for the correct order, however the pre-cert was done for a Cardiac MRI WO contrast rather than W/WO. [employer] is requesting a [employer] Case: [id number] [phone]  opt: 3  I called Christell Faith and he will be calling the peer-to-peer right now, and will notify me when it is finished.

## 2021-08-16 NOTE — Telephone Encounter (Signed)
Was notified by Thurmond Butts the Peer-to-peer was done. I called Willette Cluster and informed her.  She was grateful for the call back.

## 2021-08-16 NOTE — Telephone Encounter (Signed)
Pt was seen on 8/26.  Pt was calling to follow up about an ultrasound, but there is not an order for the u/s.  Will you place the order please?

## 2021-08-16 NOTE — Progress Notes (Signed)
BP 113/73   Pulse 72   Temp 98.6 F (37 C) (Oral)   Wt 185 lb 9.6 oz (84.2 kg)   LMP 08/05/2021 (Exact Date)   SpO2 98%   BMI 32.88 kg/m    Subjective:    Patient ID: Marilyn Francis, female    DOB: 1981/03/03, 40 y.o.   MRN: [id number]  HPI: Marilyn Francis is a 40 y.o. female  Chief Complaint  Patient presents with   Back Pain    1 month f/up   Has not gotten into see the orthopedist yet. Seeing the interventional pain in a couple of weeks. She has followed up with cardiology and OB/GYN and they are noticing that everything is stable. She is going to be getting a [place] with them in the next few weeks. She continues with pain in her back and her legs. It's aching and sore. Usually about a 2-3/10, but has been getting a bit worse when she is sitting for long periods. Also worse with work. It's stable and not debilitating. She is otherwise feeling OK with no other concerns or complaints at this time.   Relevant past medical, surgical, family and social history reviewed and updated as indicated. Interim medical history since our last visit reviewed. Allergies and medications reviewed and updated.  Review of Systems  Constitutional: Negative.   Respiratory: Negative.    Cardiovascular: Negative.   Gastrointestinal: Negative.   Genitourinary: Negative.   Musculoskeletal:  Positive for back pain and myalgias. Negative for arthralgias, gait problem, joint swelling, neck pain and neck stiffness.  Skin: Negative.   Neurological: Negative.   Psychiatric/Behavioral: Negative.     Per HPI unless specifically indicated above     Objective:    BP 113/73   Pulse 72   Temp 98.6 F (37 C) (Oral)   Wt 185 lb 9.6 oz (84.2 kg)   LMP 08/05/2021 (Exact Date)   SpO2 98%   BMI 32.88 kg/m   Wt Readings from Last 3 Encounters:  08/16/21 185 lb 9.6 oz (84.2 kg)  08/02/21 182 lb (82.6 kg)  07/05/21 179 lb (81.2 kg)    Physical Exam Vitals and nursing note reviewed.  Constitutional:       General: She is not in acute distress.    Appearance: Normal appearance. She is not ill-appearing, toxic-appearing or diaphoretic.  HENT:     Head: Normocephalic and atraumatic.     Right Ear: External ear normal.     Left Ear: External ear normal.     Nose: Nose normal.     Mouth/Throat:     Mouth: Mucous membranes are moist.     Pharynx: Oropharynx is clear.  Eyes:     General: No scleral icterus.       Right eye: No discharge.        Left eye: No discharge.     Extraocular Movements: Extraocular movements intact.     Conjunctiva/sclera: Conjunctivae normal.     Pupils: Pupils are equal, round, and reactive to light.  Cardiovascular:     Rate and Rhythm: Normal rate and regular rhythm.     Pulses: Normal pulses.     Heart sounds: Normal heart sounds. No murmur heard.   No friction rub. No gallop.  Pulmonary:     Effort: Pulmonary effort is normal. No respiratory distress.     Breath sounds: Normal breath sounds. No stridor. No wheezing, rhonchi or rales.  Chest:     Chest wall: No tenderness.  Musculoskeletal:  General: Normal range of motion.     Cervical back: Normal range of motion and neck supple.  Skin:    General: Skin is warm and dry.     Capillary Refill: Capillary refill takes less than 2 seconds.     Coloration: Skin is not jaundiced or pale.     Findings: No bruising, erythema, lesion or rash.  Neurological:     General: No focal deficit present.     Mental Status: She is alert and oriented to person, place, and time. Mental status is at baseline.  Psychiatric:        Mood and Affect: Mood normal.        Behavior: Behavior normal.        Thought Content: Thought content normal.        Judgment: Judgment normal.    Results for orders placed or performed in visit on 08/02/21  Cytology - PAP  Result Value Ref Range   High risk HPV Negative    Adequacy      Satisfactory for evaluation; transformation zone component PRESENT.   Diagnosis      -  Negative for intraepithelial lesion or malignancy (NILM)   Comment Normal Reference Range HPV - Negative       Assessment & Plan:   Problem List Items Addressed This Visit   None Visit Diagnoses     Lumbar radiculopathy    -  Primary   Discussed neuroleptics, and she didn't want it at this time. She will continue to follow with ortho. Call with any concerns or if not getting better.         Follow up plan: Return 6-9 months physical.   >15 minutes spent with patient today.

## 2021-08-18 ENCOUNTER — Other Ambulatory Visit: Payer: Self-pay | Admitting: Obstetrics & Gynecology

## 2021-08-18 DIAGNOSIS — R1032 Left lower quadrant pain: Secondary | ICD-10-CM

## 2021-08-18 NOTE — Telephone Encounter (Signed)
Yes, schedule [place] (ordered) and follow up appt (virtual or in person)

## 2021-08-28 ENCOUNTER — Ambulatory Visit: Payer: 59

## 2021-09-03 ENCOUNTER — Ambulatory Visit: Payer: 59

## 2021-09-05 ENCOUNTER — Other Ambulatory Visit: Payer: 59

## 2021-09-05 ENCOUNTER — Ambulatory Visit: Payer: 59 | Admitting: Obstetrics & Gynecology

## 2021-09-06 ENCOUNTER — Ambulatory Visit: Payer: Self-pay | Admitting: Physician Assistant

## 2021-09-06 ENCOUNTER — Encounter: Payer: Self-pay | Admitting: Physician Assistant

## 2021-09-06 ENCOUNTER — Other Ambulatory Visit: Payer: Self-pay

## 2021-09-06 DIAGNOSIS — Z113 Encounter for screening for infections with a predominantly sexual mode of transmission: Secondary | ICD-10-CM

## 2021-09-06 LAB — WET PREP FOR TRICH, YEAST, CLUE
Trichomonas Exam: NEGATIVE
Yeast Exam: NEGATIVE

## 2021-09-06 NOTE — Progress Notes (Signed)
[facility] Department STI clinic/screening visit  Subjective:  Marilyn Francis is a 40 y.o. female being seen today for an STI screening visit. The patient reports they do not have symptoms.  Patient reports that they do not desire a pregnancy in the next year.   They reported they are not interested in discussing contraception today.  No LMP recorded.   Patient has the following medical conditions:   Patient Active Problem List   Diagnosis Date Noted   Elevated troponin 06/12/2021   NSTEMI (non-ST elevated myocardial infarction) ([place]) 06/11/2021   Lower abdominal pain 06/11/2021   Chronic neck pain 12/17/2020   Chronic cough 11/05/2020   Irritable bowel syndrome with constipation 06/07/2020   Eczema 06/07/2020   Bipolar 2 disorder ([place])    Allergic rhinitis     Chief Complaint  Patient presents with   SEXUALLY TRANSMITTED DISEASE    screening    HPI  Patient reports that she is not having any symptoms but would like a screening today.  Reports that she takes medicines as prescribed for allergies.  States last HIV test was in April and last pap was last month.    See flowsheet for further details and programmatic requirements.    The following portions of the patient's history were reviewed and updated as appropriate: allergies, current medications, past medical history, past social history, past surgical history and problem list.  Objective:  There were no vitals filed for this visit.  Physical Exam Constitutional:      General: She is not in acute distress.    Appearance: Normal appearance.  HENT:     Head: Normocephalic and atraumatic.     Comments: No nits,lice, or hair loss. No cervical, supraclavicular or axillary adenopathy.     Mouth/Throat:     Mouth: Mucous membranes are moist.     Pharynx: Oropharynx is clear. No oropharyngeal exudate or posterior oropharyngeal erythema.  Eyes:     Conjunctiva/sclera: Conjunctivae normal.  Pulmonary:      Effort: Pulmonary effort is normal.  Abdominal:     Palpations: Abdomen is soft. There is no mass.     Tenderness: There is no abdominal tenderness. There is no guarding or rebound.  Genitourinary:    General: Normal vulva.     Rectum: Normal.     Comments: External genitalia/pubic area without nits, lice, edema, erythema, lesions and inguinal adenopathy. Vagina with normal mucosa and discharge. Cervix without visible lesions. Uterus firm, mobile, nt, no masses, no CMT, no adnexal tenderness or fullness.  Musculoskeletal:     Cervical back: Neck supple. No tenderness.  Skin:    General: Skin is warm and dry.     Findings: No bruising, erythema, lesion or rash.  Neurological:     Mental Status: She is alert and oriented to person, place, and time.  Psychiatric:        Mood and Affect: Mood normal.        Behavior: Behavior normal.        Thought Content: Thought content normal.        Judgment: Judgment normal.     Assessment and Plan:  [name] is a 40 y.o. female presenting to the [facility] Department for STI screening  1. Screening for STD (sexually transmitted disease) Patient into clinic without symptoms. Reviewed with patient that wet mount is normal and no treatment is indicated today. Rec condoms with all sex. Await test results.  Counseled that RN will call if needs  to RTC for treatment once results are back.  - WET PREP FOR [place], YEAST, [place] [facility] - Syphilis Serology, [place] [facility] - Chlamydia/Gonorrhea [facility]     No follow-ups on file.  Future Appointments  Date Time Provider [place]  09/19/2021  3:30 PM WS-WS [place] 2 WS-IMG None  09/19/2021  4:30 PM Gae Dry, MD WS-WS None  02/19/2022  9:00 AM Valerie Roys, DO CFP-CFP [place], [place]

## 2021-09-17 ENCOUNTER — Ambulatory Visit
Admission: RE | Admit: 2021-09-17 | Discharge: 2021-09-17 | Disposition: A | Discharge status: Home / Self Care | Payer: 59 | Source: Ambulatory Visit | Attending: Obstetrics & Gynecology | Admitting: Obstetrics & Gynecology

## 2021-09-17 ENCOUNTER — Other Ambulatory Visit: Payer: Self-pay

## 2021-09-17 DIAGNOSIS — R1032 Left lower quadrant pain: Secondary | ICD-10-CM | POA: Diagnosis not present

## 2021-09-17 IMAGING — US US PELVIS COMPLETE WITH TRANSVAGINAL
1 series · 13 of 25 positions shown · non-contrast
Comparison: None

CLINICAL DATA: LEFT lower quadrant pain for 4 months, LMP
[DATE]



[Series 1: us pelvis complete with transvaginal · 0.22mm/px · 13 of 92 slices shown]
[im 1/92]
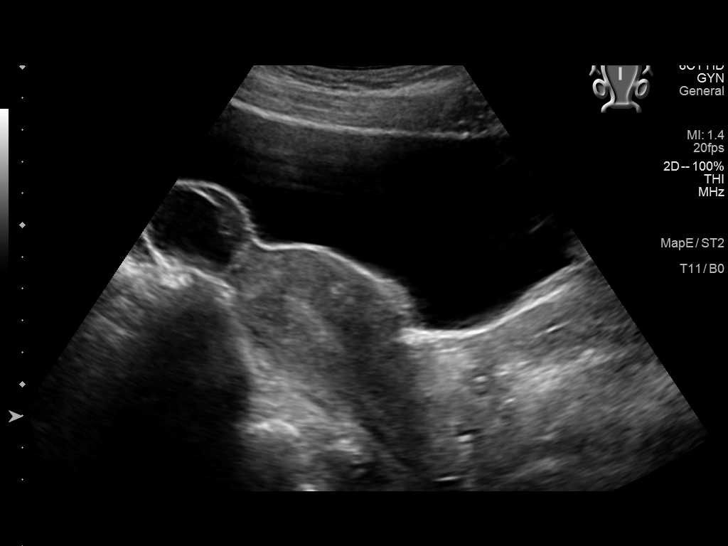
[im 8/92]
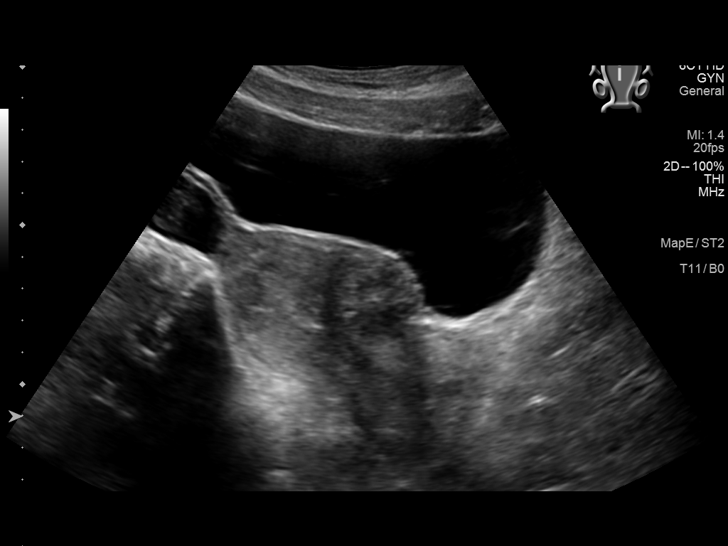
[im 16/92]
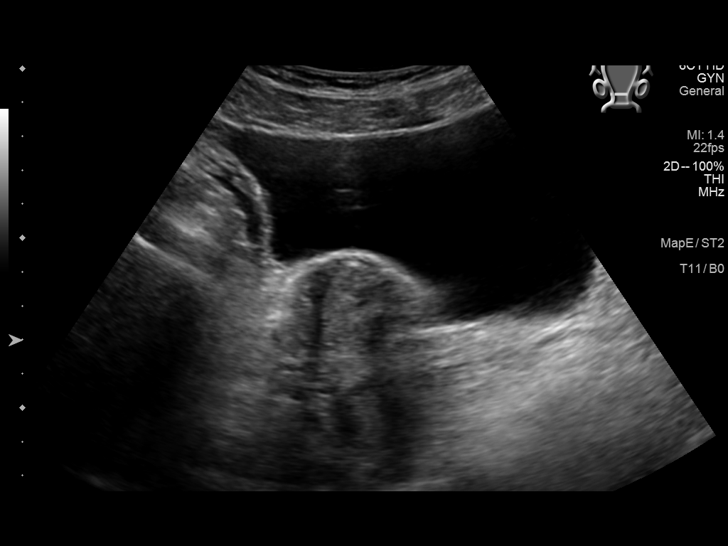
[im 23/92]
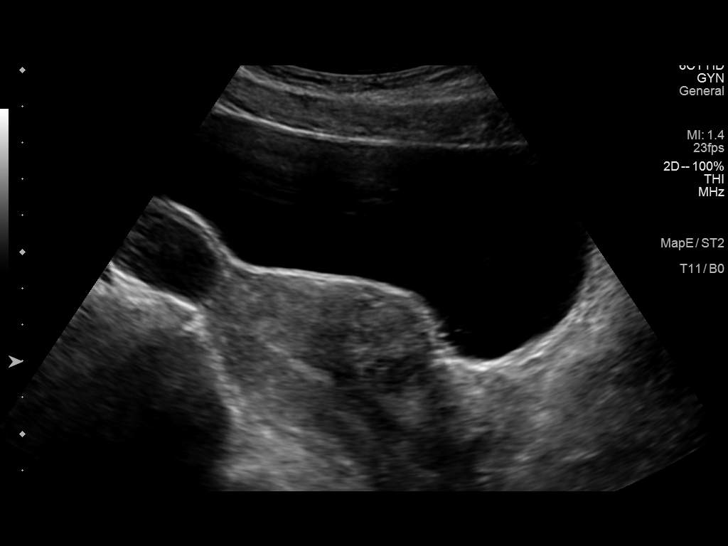
[im 31/92]
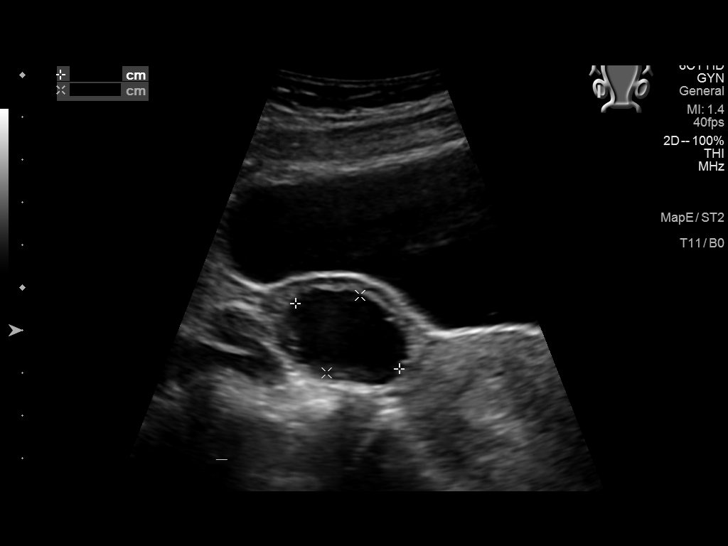
[im 38/92]
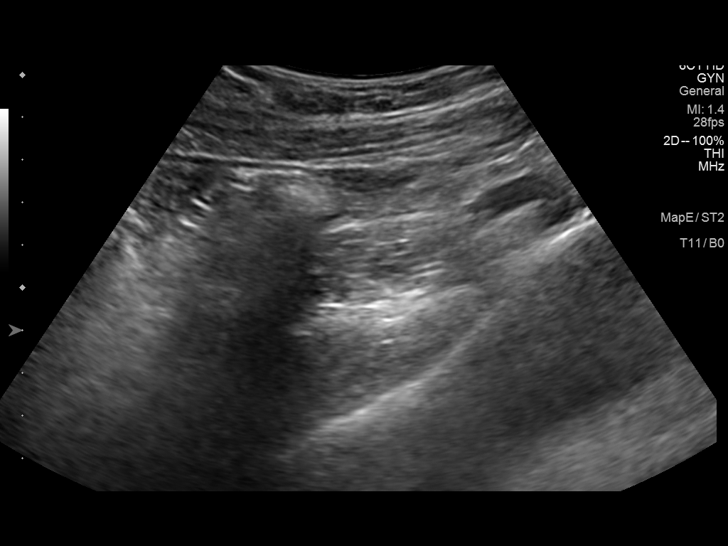
[im 46/92]
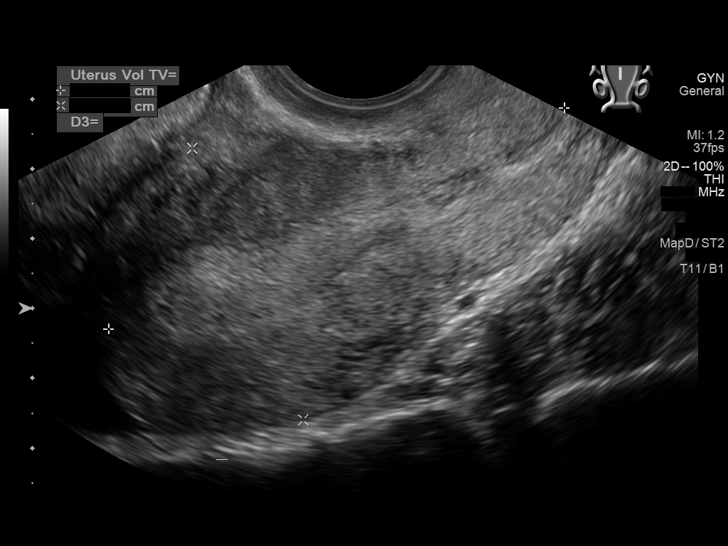
[im 54/92]
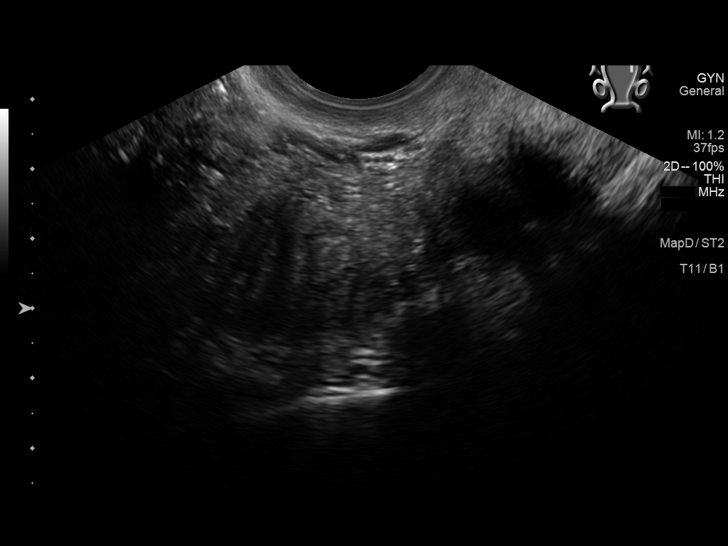
[im 61/92]
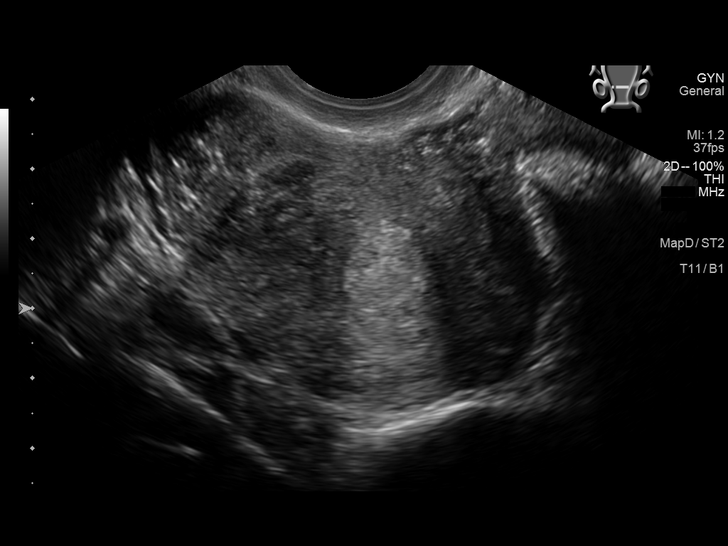
[im 69/92]
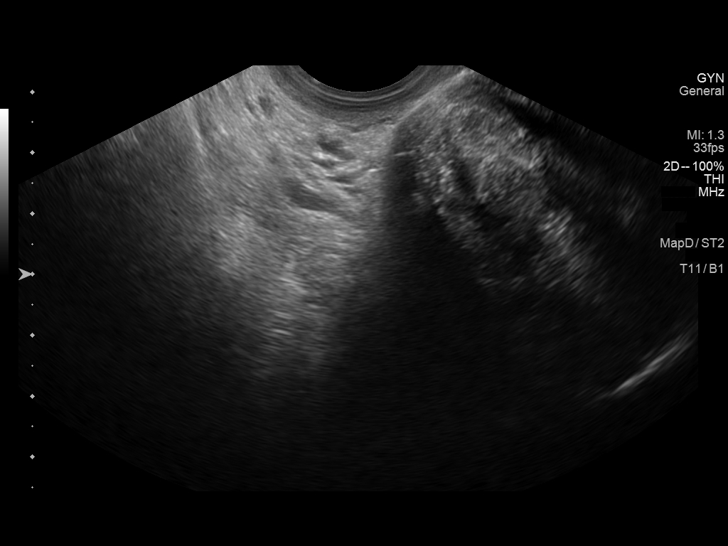
[im 76/92]
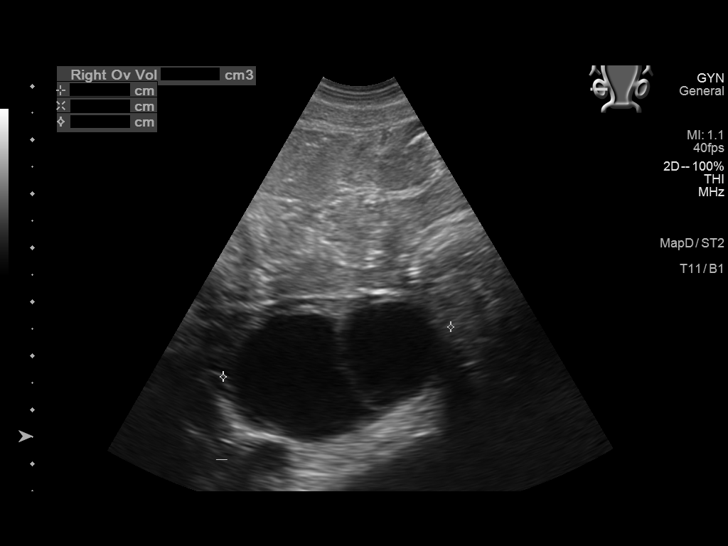
[im 84/92]
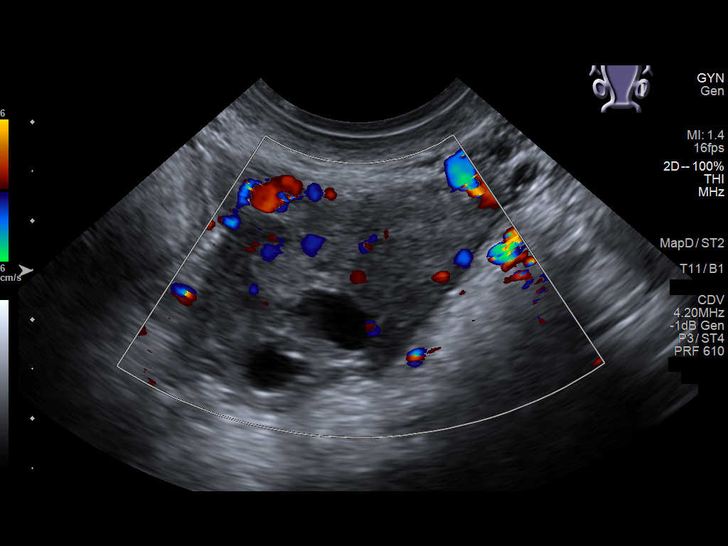
[im 92/92]
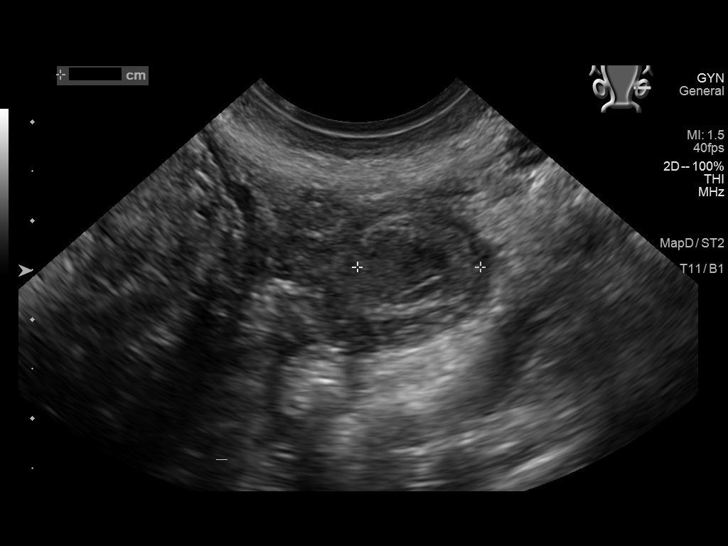

[13 of 25 positions shown; findings below may reference images not displayed]

FINDINGS: Uterus

Measurements: 9.6 x 3.9 x 5.0 cm = volume: 97 mL. Anteverted.
Heterogeneous myometrium. Mass identified at anterior RIGHT upper
uterus, subserosal, 3.9 x 3.3 x 3.7 cm, consistent with leiomyoma.
No additional uterine masses. Nabothian cyst at cervix.

Endometrium

Thickness: 12 mm.  No endometrial fluid or mass

Right ovary

Measurements: 3.8 x 3.0 x 4.3 cm = volume: 26.1 mL. 3.3 cm diameter
simple cyst RIGHT ovary; no follow-up imaging recommended. No
additional worrisome masses.

Left ovary

Measurements: 3.3 x 1.9 x 1.9 cm = volume: 6.1 mL. Small probable
resolving corpus luteum. No additional masses.

Other findings

No free pelvic fluid.  No adnexal masses.
IMPRESSION: 3.9 cm subserosal leiomyoma at RIGHT upper uterus anteriorly.

3.3 cm diameter simple cyst RIGHT ovary; no follow-up imaging
recommended.

Remainder of exam unremarkable.

## 2021-09-19 ENCOUNTER — Other Ambulatory Visit: Payer: Self-pay

## 2021-09-19 ENCOUNTER — Encounter: Payer: Self-pay | Admitting: Obstetrics & Gynecology

## 2021-09-19 ENCOUNTER — Ambulatory Visit: Payer: 59

## 2021-09-19 ENCOUNTER — Ambulatory Visit (INDEPENDENT_AMBULATORY_CARE_PROVIDER_SITE_OTHER): Payer: 59 | Admitting: Obstetrics & Gynecology

## 2021-09-19 VITALS — BP 120/80 | Ht 63.0 in | Wt 184.0 lb

## 2021-09-19 DIAGNOSIS — R1032 Left lower quadrant pain: Secondary | ICD-10-CM

## 2021-09-19 DIAGNOSIS — R6881 Early satiety: Secondary | ICD-10-CM | POA: Diagnosis not present

## 2021-09-19 DIAGNOSIS — D219 Benign neoplasm of connective and other soft tissue, unspecified: Secondary | ICD-10-CM | POA: Diagnosis not present

## 2021-09-19 NOTE — Progress Notes (Signed)
HPI: Pt has been having 4 month h/o  of LLQ pain, mild and not affecting BM, void, and not assoc w n/v/f/c.  She has some radiation to hip.  Periods are regular, no pain w periods.  No BTB.    She does report a longer standing history of early satiety w many meals, feeling full quickly and not able to finish or complete her meals then.  No nausea.  Ultrasound demonstrates right sided fibroid 3 cm and right simple 3 cm cyst. These findings are not correlating well w left sided sx's.  PMHx: She  has a past medical history of Allergic rhinitis and Bipolar 2 disorder ([place]). Also,  has a past surgical history that includes birth mark removal and Dental surgery., family history includes Breast cancer in her maternal grandmother; Cancer in her paternal grandfather; Heart disease in her paternal grandmother; Hypertension in her father and mother.,  reports that she has never smoked. She has never used smokeless tobacco. She reports current alcohol use of about 10.0 standard drinks per week. She reports that she does not use drugs.  She has a current medication list which includes the following prescription(s): azelastine hcl, calcium carbonate-vit d-min, cetirizine, cholecalciferol, ibuprofen, magnesium, multivitamin, nasacort allergy 24hr, and triamcinolone ointment. Also, has No Known Allergies.  Review of Systems  All other systems reviewed and are negative.  Objective: BP 120/80   Ht 5\' 3"  (1.6 m)   Wt 184 lb (83.5 kg)   LMP 09/01/2021 (Exact Date)   BMI 32.59 kg/m   Physical examination Constitutional NAD, Conversant  Skin No rashes, lesions or ulceration.   Extremities: Moves all appropriately.  Normal ROM for age. No lymphadenopathy.  Neuro: Grossly intact  Psych: Oriented to PPT.  Normal mood. Normal affect.   US PELVIC COMPLETE WITH TRANSVAGINAL  Result Date: 09/18/2021 CLINICAL DATA:  LEFT lower quadrant pain for 4 months, LMP 09/06/2021 EXAM: TRANSABDOMINAL AND TRANSVAGINAL  ULTRASOUND OF PELVIS TECHNIQUE: Both transabdominal and transvaginal ultrasound examinations of the pelvis were performed. Transabdominal technique was performed for global imaging of the pelvis including uterus, ovaries, adnexal regions, and pelvic cul-de-sac. It was necessary to proceed with endovaginal exam following the transabdominal exam to visualize the adnexa. COMPARISON:  None FINDINGS: Uterus Measurements: 9.6 x 3.9 x 5.0 cm = volume: 97 mL. Anteverted. Heterogeneous myometrium. Mass identified at anterior RIGHT upper uterus, subserosal, 3.9 x 3.3 x 3.7 cm, consistent with leiomyoma. No additional uterine masses. Nabothian cyst at cervix. Endometrium Thickness: 12 mm.  No endometrial fluid or mass Right ovary Measurements: 3.8 x 3.0 x 4.3 cm = volume: 26.1 mL. 3.3 cm diameter simple cyst RIGHT ovary; no follow-up imaging recommended. No additional worrisome masses. Left ovary Measurements: 3.3 x 1.9 x 1.9 cm = volume: 6.1 mL. Small probable resolving corpus luteum. No additional masses. Other findings No free pelvic fluid.  No adnexal masses. IMPRESSION: 3.9 cm subserosal leiomyoma at RIGHT upper uterus anteriorly. 3.3 cm diameter simple cyst RIGHT ovary; no follow-up imaging recommended. Remainder of exam unremarkable. Electronically Signed   By: Lavonia Dana M.D.   On: 09/18/2021 15:42    Assessment:  LLQ pain - Plan: Ambulatory referral to Gastroenterology  Early satiety - Plan: Ambulatory referral to Gastroenterology  Fibroid  GYN etiology not likely for her LLQ pain, as cyst is simple and on contralateral side, and fibroid is also right sided and typically would not cause just chronic LLQ pain (not worse w periods, periods normal, right sided).  Plan GI for  continued work up for not only LLQ pain but also these sx's of early satiety.  Maybe there is a connection there.  Additional etiology to consider of musculoskeletal and nerve based discussed as well. Consideration for PT or pain clinic  follow up.  A total of 20 minutes were spent face-to-face with the patient as well as preparation, review, communication, and documentation during this encounter.   Barnett Applebaum, MD, [facility], [facility] 09/19/2021  4:43 PM

## 2021-09-25 ENCOUNTER — Other Ambulatory Visit: Payer: Self-pay

## 2021-09-25 ENCOUNTER — Ambulatory Visit (INDEPENDENT_AMBULATORY_CARE_PROVIDER_SITE_OTHER): Payer: 59 | Admitting: Family Medicine

## 2021-09-25 ENCOUNTER — Encounter: Payer: Self-pay | Admitting: Family Medicine

## 2021-09-25 VITALS — BP 123/83 | HR 67 | Ht 63.0 in | Wt 184.0 lb

## 2021-09-25 DIAGNOSIS — Z13220 Encounter for screening for lipoid disorders: Secondary | ICD-10-CM

## 2021-09-25 DIAGNOSIS — R5382 Chronic fatigue, unspecified: Secondary | ICD-10-CM

## 2021-09-25 DIAGNOSIS — R609 Edema, unspecified: Secondary | ICD-10-CM

## 2021-09-25 DIAGNOSIS — M2550 Pain in unspecified joint: Secondary | ICD-10-CM | POA: Diagnosis not present

## 2021-09-25 LAB — BAYER DCA HB A1C WAIVED: HB A1C (BAYER DCA - WAIVED): 4.8 % (ref 4.8–5.6)

## 2021-09-25 NOTE — Progress Notes (Signed)
BP 123/83   Pulse 67   Ht 5' 3" (1.6 m)   Wt 184 lb (83.5 kg)   LMP 09/01/2021 (Exact Date)   BMI 32.59 kg/m    Subjective:    Patient ID: Marilyn Francis, female    DOB: 08-27-1981, 40 y.o.   MRN: [id number]  HPI: Chinelo Benn is a 40 y.o. female  Chief Complaint  Patient presents with   Edema    Lower extremities   Has been having swelling in her legs since she went into her hospital in July. Nothing really seems to make it better or worse. Has been doing inversion and massage and rubs. Has used compression socks with mixed results. She is concerned as she has been tired all the time and has been having swelling in her joints and joint pains. She notes that she is not having any redness in her legs or heat. She has been having some pain. She is otherwise doing OK. No other concerns or complaints at this time.   Relevant past medical, surgical, family and social history reviewed and updated as indicated. Interim medical history since our last visit reviewed. Allergies and medications reviewed and updated.  Review of Systems  Constitutional: Negative.   Respiratory: Negative.    Cardiovascular:  Positive for leg swelling. Negative for chest pain and palpitations.  Musculoskeletal:  Positive for myalgias. Negative for arthralgias, back pain, gait problem, joint swelling, neck pain and neck stiffness.  Neurological: Negative.   Psychiatric/Behavioral: Negative.     Per HPI unless specifically indicated above     Objective:    BP 123/83   Pulse 67   Ht 5' 3" (1.6 m)   Wt 184 lb (83.5 kg)   LMP 09/01/2021 (Exact Date)   BMI 32.59 kg/m   Wt Readings from Last 3 Encounters:  09/25/21 184 lb (83.5 kg)  09/19/21 184 lb (83.5 kg)  08/16/21 185 lb 9.6 oz (84.2 kg)    Physical Exam Vitals and nursing note reviewed.  Constitutional:      General: She is not in acute distress.    Appearance: Normal appearance. She is not ill-appearing, toxic-appearing or diaphoretic.   HENT:     Head: Normocephalic and atraumatic.     Right Ear: External ear normal.     Left Ear: External ear normal.     Nose: Nose normal.     Mouth/Throat:     Mouth: Mucous membranes are moist.     Pharynx: Oropharynx is clear.  Eyes:     General: No scleral icterus.       Right eye: No discharge.        Left eye: No discharge.     Extraocular Movements: Extraocular movements intact.     Conjunctiva/sclera: Conjunctivae normal.     Pupils: Pupils are equal, round, and reactive to light.  Cardiovascular:     Rate and Rhythm: Normal rate and regular rhythm.     Pulses: Normal pulses.     Heart sounds: Normal heart sounds. No murmur heard.   No friction rub. No gallop.  Pulmonary:     Effort: Pulmonary effort is normal. No respiratory distress.     Breath sounds: Normal breath sounds. No stridor. No wheezing, rhonchi or rales.  Chest:     Chest wall: No tenderness.  Musculoskeletal:        General: Normal range of motion.     Cervical back: Normal range of motion and neck supple.  Skin:  General: Skin is warm and dry.     Capillary Refill: Capillary refill takes less than 2 seconds.     Coloration: Skin is not jaundiced or pale.     Findings: No bruising, erythema, lesion or rash.  Neurological:     General: No focal deficit present.     Mental Status: She is alert and oriented to person, place, and time. Mental status is at baseline.  Psychiatric:        Mood and Affect: Mood normal.        Behavior: Behavior normal.        Thought Content: Thought content normal.        Judgment: Judgment normal.    Results for orders placed or performed in visit on 09/25/21  Comprehensive metabolic panel  Result Value Ref Range   Glucose 81 70 - 99 mg/dL   BUN 13 6 - 24 mg/dL   Creatinine, Ser 0.86 0.57 - 1.00 mg/dL   eGFR 87 >59 mL/min/1.73   BUN/Creatinine Ratio 15 9 - 23   Sodium 142 134 - 144 mmol/L   Potassium 4.6 3.5 - 5.2 mmol/L   Chloride 105 96 - 106 mmol/L   CO2  22 20 - 29 mmol/L   Calcium 9.2 8.7 - 10.2 mg/dL   Total Protein 6.5 6.0 - 8.5 g/dL   Albumin 4.2 3.8 - 4.8 g/dL   Globulin, Total 2.3 1.5 - 4.5 g/dL   Albumin/Globulin Ratio 1.8 1.2 - 2.2   Bilirubin Total 0.5 0.0 - 1.2 mg/dL   Alkaline Phosphatase 66 44 - 121 IU/L   AST 15 0 - 40 IU/L   ALT 5 0 - 32 IU/L  Bayer DCA Hb A1c Waived  Result Value Ref Range   HB A1C (BAYER DCA - WAIVED) 4.8 4.8 - 5.6 %  Lipid Panel w/o Chol/HDL Ratio  Result Value Ref Range   Cholesterol, Total 169 100 - 199 mg/dL   Triglycerides 36 0 - 149 mg/dL   HDL 98 >39 mg/dL   VLDL Cholesterol Cal 8 5 - 40 mg/dL   LDL Chol Calc (NIH) 63 0 - 99 mg/dL  VITAMIN D 25 Hydroxy (Vit-D Deficiency, Fractures)  Result Value Ref Range   Vit D, 25-Hydroxy 32.4 30.0 - 100.0 ng/mL  CBC with Differential/Platelet  Result Value Ref Range   WBC 5.8 3.4 - 10.8 x10E3/uL   RBC 4.08 3.77 - 5.28 x10E6/uL   Hemoglobin 12.7 11.1 - 15.9 g/dL   Hematocrit 39.0 34.0 - 46.6 %   MCV 96 79 - 97 fL   MCH 31.1 26.6 - 33.0 pg   MCHC 32.6 31.5 - 35.7 g/dL   RDW 13.1 11.7 - 15.4 %   Platelets 199 150 - 450 x10E3/uL   Neutrophils 72 Not Estab. %   Lymphs 20 Not Estab. %   Monocytes 5 Not Estab. %   Eos 3 Not Estab. %   Basos 0 Not Estab. %   Neutrophils Absolute 4.1 1.4 - 7.0 x10E3/uL   Lymphocytes Absolute 1.2 0.7 - 3.1 x10E3/uL   Monocytes Absolute 0.3 0.1 - 0.9 x10E3/uL   EOS (ABSOLUTE) 0.2 0.0 - 0.4 x10E3/uL   Basophils Absolute 0.0 0.0 - 0.2 x10E3/uL   Immature Granulocytes 0 Not Estab. %   Immature Grans (Abs) 0.0 0.0 - 0.1 x10E3/uL  Estradiol  Result Value Ref Range   Estradiol 152.0 pg/mL  LH  Result Value Ref Range   LH 5.6 mIU/mL  [facility]  Result Value Ref  Range   FSH 3.0 mIU/mL  Testosterone, free, total([facility]/Sunquest)  Result Value Ref Range   Testosterone 72 (H) 4 - 50 ng/dL   Testosterone, Free 6.7 (H) 0.0 - 4.2 pg/mL   Sex Hormone Binding 72.6 24.6 - 122.0 nmol/L  Thyroid Panel With TSH  Result Value Ref  Range   TSH 2.320 0.450 - 4.500 uIU/mL   T4, Total 6.4 4.5 - 12.0 ug/dL   T3 Uptake Ratio 28 24 - 39 %   Free Thyroxine Index 1.8 1.2 - 4.9  Antinuclear Antib (ANA)  Result Value Ref Range   Anti Nuclear Antibody (ANA) Negative Negative  RA Qn+CCP(IgG/A)+SjoSSA+SjoSSB  Result Value Ref Range   Rhuematoid fact SerPl-aCnc <10.0 <14.0 IU/mL   ENA SSA (RO) Ab <0.2 0.0 - 0.9 AI   ENA SSB (LA) Ab <0.2 0.0 - 0.9 AI   Cyclic Citrullin Peptide Ab 3 0 - 19 units  Uric acid  Result Value Ref Range   Uric Acid 4.7 2.6 - 6.2 mg/dL  Sed Rate (ESR)  Result Value Ref Range   Sed Rate 2 0 - 32 mm/hr  CK (Creatine Kinase)  Result Value Ref Range   Total CK 63 32 - 182 U/L  C-reactive protein  Result Value Ref Range   CRP 1 0 - 10 mg/L      Assessment & Plan:   Problem List Items Addressed This Visit   None Visit Diagnoses     Peripheral edema    -  Primary   Trace edema. Patient would like to see vascular. Referral generated today.    Relevant Orders   Ambulatory referral to Vascular Surgery   Comprehensive metabolic panel (Completed)   Bayer DCA Hb A1c Waived (Completed)   VITAMIN D 25 Hydroxy (Vit-D Deficiency, Fractures) (Completed)   CBC with Differential/Platelet (Completed)   Ambulatory referral to Rheumatology   Chronic fatigue       Will recheck labs today. Patient would like to see rheumatology. Referral generated today.   Relevant Orders   Comprehensive metabolic panel (Completed)   Bayer DCA Hb A1c Waived (Completed)   VITAMIN D 25 Hydroxy (Vit-D Deficiency, Fractures) (Completed)   CBC with Differential/Platelet (Completed)   Estradiol (Completed)   LH (Completed)   FSH (Completed)   Testosterone, free, total([facility]/Sun[facility]) (Completed)   Thyroid Panel With TSH (Completed)   Ambulatory referral to Rheumatology   Arthralgia, unspecified joint       Will recheck labs today. Patient would like to see rheumatology. Referral generated today.   Relevant Orders    Antinuclear Antib (ANA) (Completed)   RA Qn+CCP(IgG/A)+SjoSSA+SjoSSB (Completed)   Uric acid (Completed)   Sed Rate (ESR) (Completed)   CK (Creatine Kinase) (Completed)   C-reactive protein (Completed)   Ambulatory referral to Rheumatology   Screening for cholesterol level       Labs drawn today. Await results.    Relevant Orders   Lipid Panel w/o Chol/HDL Ratio (Completed)        Follow up plan: Return As scheduled.  >30 minutes spent with patient today

## 2021-09-27 LAB — COMPREHENSIVE METABOLIC PANEL
ALT: 5 IU/L (ref 0–32)
AST: 15 IU/L (ref 0–40)
Albumin/Globulin Ratio: 1.8 (ref 1.2–2.2)
Albumin: 4.2 g/dL (ref 3.8–4.8)
Alkaline Phosphatase: 66 IU/L (ref 44–121)
BUN/Creatinine Ratio: 15 (ref 9–23)
BUN: 13 mg/dL (ref 6–24)
Bilirubin Total: 0.5 mg/dL (ref 0.0–1.2)
CO2: 22 mmol/L (ref 20–29)
Calcium: 9.2 mg/dL (ref 8.7–10.2)
Chloride: 105 mmol/L (ref 96–106)
Creatinine, Ser: 0.86 mg/dL (ref 0.57–1.00)
Globulin, Total: 2.3 g/dL (ref 1.5–4.5)
Glucose: 81 mg/dL (ref 70–99)
Potassium: 4.6 mmol/L (ref 3.5–5.2)
Sodium: 142 mmol/L (ref 134–144)
Total Protein: 6.5 g/dL (ref 6.0–8.5)
eGFR: 87 mL/min/{1.73_m2} (ref >59)

## 2021-09-27 LAB — TESTOSTERONE, FREE, TOTAL, SHBG
Sex Hormone Binding: 72.6 nmol/L (ref 24.6–122.0)
Testosterone, Free: 6.7 pg/mL — ABNORMAL HIGH (ref 0.0–4.2)
Testosterone: 72 ng/dL — ABNORMAL HIGH (ref 4–50)

## 2021-09-27 LAB — CBC WITH DIFFERENTIAL/PLATELET
Basophils Absolute: 0 10*3/uL (ref 0.0–0.2)
Basos: 0 %
EOS (ABSOLUTE): 0.2 10*3/uL (ref 0.0–0.4)
Eos: 3 %
Hematocrit: 39 % (ref 34.0–46.6)
Hemoglobin: 12.7 g/dL (ref 11.1–15.9)
Immature Grans (Abs): 0 10*3/uL (ref 0.0–0.1)
Immature Granulocytes: 0 %
Lymphocytes Absolute: 1.2 10*3/uL (ref 0.7–3.1)
Lymphs: 20 %
MCH: 31.1 pg (ref 26.6–33.0)
MCHC: 32.6 g/dL (ref 31.5–35.7)
MCV: 96 fL (ref 79–97)
Monocytes Absolute: 0.3 10*3/uL (ref 0.1–0.9)
Monocytes: 5 %
Neutrophils Absolute: 4.1 10*3/uL (ref 1.4–7.0)
Neutrophils: 72 %
Platelets: 199 10*3/uL (ref 150–450)
RBC: 4.08 x10E6/uL (ref 3.77–5.28)
RDW: 13.1 % (ref 11.7–15.4)
WBC: 5.8 10*3/uL (ref 3.4–10.8)

## 2021-09-27 LAB — THYROID PANEL WITH TSH
Free Thyroxine Index: 1.8 (ref 1.2–4.9)
T3 Uptake Ratio: 28 % (ref 24–39)
T4, Total: 6.4 ug/dL (ref 4.5–12.0)
TSH: 2.32 u[IU]/mL (ref 0.450–4.500)

## 2021-09-27 LAB — FOLLICLE STIMULATING HORMONE: FSH: 3 m[IU]/mL

## 2021-09-27 LAB — ANA: Anti Nuclear Antibody (ANA): NEGATIVE

## 2021-09-27 LAB — LIPID PANEL W/O CHOL/HDL RATIO
Cholesterol, Total: 169 mg/dL (ref 100–199)
HDL: 98 mg/dL (ref >39)
LDL Chol Calc (NIH): 63 mg/dL (ref 0–99)
Triglycerides: 36 mg/dL (ref 0–149)
VLDL Cholesterol Cal: 8 mg/dL (ref 5–40)

## 2021-09-27 LAB — RA QN+CCP(IGG/A)+SJOSSA+SJOSSB
Cyclic Citrullin Peptide Ab: 3 units (ref 0–19)
ENA SSA (RO) Ab: <0.2 AI (ref 0.0–0.9)
ENA SSB (LA) Ab: <0.2 AI (ref 0.0–0.9)
Rheumatoid fact SerPl-aCnc: <10 IU/mL (ref <14.0)

## 2021-09-27 LAB — C-REACTIVE PROTEIN: CRP: 1 mg/L (ref 0–10)

## 2021-09-27 LAB — SEDIMENTATION RATE: Sed Rate: 2 mm/hr (ref 0–32)

## 2021-09-27 LAB — URIC ACID: Uric Acid: 4.7 mg/dL (ref 2.6–6.2)

## 2021-09-27 LAB — VITAMIN D 25 HYDROXY (VIT D DEFICIENCY, FRACTURES): Vit D, 25-Hydroxy: 32.4 ng/mL (ref 30.0–100.0)

## 2021-09-27 LAB — CK: Total CK: 63 U/L (ref 32–182)

## 2021-09-27 LAB — LUTEINIZING HORMONE: LH: 5.6 m[IU]/mL

## 2021-09-27 LAB — ESTRADIOL: Estradiol: 152 pg/mL

## 2021-10-05 ENCOUNTER — Encounter: Payer: Self-pay | Admitting: Family Medicine

## 2021-10-17 ENCOUNTER — Ambulatory Visit (INDEPENDENT_AMBULATORY_CARE_PROVIDER_SITE_OTHER): Payer: 59 | Admitting: Nurse Practitioner

## 2021-10-17 ENCOUNTER — Other Ambulatory Visit: Payer: Self-pay

## 2021-10-17 ENCOUNTER — Encounter (INDEPENDENT_AMBULATORY_CARE_PROVIDER_SITE_OTHER): Payer: Self-pay | Admitting: Nurse Practitioner

## 2021-10-17 VITALS — BP 123/84 | HR 74 | Resp 16 | Ht 63.0 in | Wt 185.8 lb

## 2021-10-17 DIAGNOSIS — M7989 Other specified soft tissue disorders: Secondary | ICD-10-CM

## 2021-10-27 NOTE — Progress Notes (Signed)
Subjective:    Patient ID: Marilyn Francis, female    DOB: 06/15/1981, 40 y.o.   MRN: [id number] Chief Complaint  Patient presents with   New Patient (Initial Visit)    Ref Wynetta Emery peripheral edema     Marilyn Francis is a 40 year old female that is here for evaluation of lower extremity edema.  The patient recently had COVID in July and was subsequently hospitalized.  Since that time the patient has not been at her full level of activity and has noticed swelling since that time.  She is utilize medical grade compression stockings with little significant difference in swelling.  There have been no open wounds or ulcerations.   Review of Systems  Cardiovascular:  Positive for leg swelling.  All other systems reviewed and are negative.     Objective:   Physical Exam Vitals reviewed.  HENT:     Head: Normocephalic.  Cardiovascular:     Rate and Rhythm: Normal rate and regular rhythm.     Pulses: Normal pulses.  Pulmonary:     Effort: Pulmonary effort is normal.  Skin:    General: Skin is warm and dry.  Neurological:     Mental Status: She is alert and oriented to person, place, and time.  Psychiatric:        Mood and Affect: Mood normal.        Behavior: Behavior normal.        Thought Content: Thought content normal.        Judgment: Judgment normal.    BP 123/84 (BP Location: Right Arm)   Pulse 74   Resp 16   Ht 5\' 3"  (1.6 m)   Wt 185 lb 12.8 oz (84.3 kg)   BMI 32.91 kg/m   Past Medical History:  Diagnosis Date   Allergic rhinitis    Bipolar 2 disorder ([facility])     Social History   Socioeconomic History   Marital status: Married    Spouse name: Not on file   Number of children: Not on file   Years of education: Not on file   Highest education level: Not on file  Occupational History   Not on file  Tobacco Use   Smoking status: Never   Smokeless tobacco: Never  Vaping Use   Vaping Use: Never used  Substance and Sexual Activity   Alcohol use: Yes     Alcohol/week: 10.0 standard drinks    Types: 10 Standard drinks or equivalent per week   Drug use: No   Sexual activity: Yes    Birth control/protection: None  Other Topics Concern   Not on file  Social History Narrative   Not on file   Social Determinants of Health   Financial Resource Strain: Not on file  Food Insecurity: Not on file  Transportation Needs: Not on file  Physical Activity: Not on file  Stress: Not on file  Social Connections: Not on file  Intimate Partner Violence: Not on file    Past Surgical History:  Procedure Laterality Date   birth mark removal     DENTAL SURGERY      Family History  Problem Relation Age of Onset   Hypertension Mother    Hypertension Father    Breast cancer Maternal Grandmother    Heart disease Paternal Grandmother    Cancer Paternal Grandfather        Colon    No Known Allergies  CBC Latest Ref Rng & Units 09/25/2021 06/11/2021 06/07/2020  WBC 3.4 -  10.8 x10E3/uL 5.8 6.9 4.3  Hemoglobin 11.1 - 15.9 g/dL 12.7 13.5 13.5  Hematocrit 34.0 - 46.6 % 39.0 40.5 40.0  Platelets 150 - 450 x10E3/uL 199 210 202      CMP     Component Value Date/Time   NA 142 09/25/2021 0920   K 4.6 09/25/2021 0920   CL 105 09/25/2021 0920   CO2 22 09/25/2021 0920   GLUCOSE 81 09/25/2021 0920   BUN 13 09/25/2021 0920   CREATININE 0.86 09/25/2021 0920   CALCIUM 9.2 09/25/2021 0920   PROT 6.5 09/25/2021 0920   ALBUMIN 4.2 09/25/2021 0920   AST 15 09/25/2021 0920   ALT 5 09/25/2021 0920   ALKPHOS 66 09/25/2021 0920   BILITOT 0.5 09/25/2021 0920   GFRNONAA 72 06/07/2020 1053   GFRAA 82 06/07/2020 1053     No results found.     Assessment & Plan:   1. Leg swelling I have had a long discussion with the patient regarding swelling and why it  causes symptoms.  Patient will begin wearing graduated compression stockings class 1 (20-30 mmHg) on a daily basis a prescription was given. The patient will  beginning wearing the stockings first thing  in the morning and removing them in the evening. The patient is instructed specifically not to sleep in the stockings.   In addition, behavioral modification will be initiated.  This will include frequent elevation, use of over the counter pain medications and exercise such as walking.  I have reviewed systemic causes for chronic edema such as liver, kidney and cardiac etiologies.  The patient denies problems with these organ systems.    Consideration for a lymph pump will also be made based upon the effectiveness of conservative therapy.  This would help to improve the edema control and prevent sequela such as ulcers and infections   Patient should undergo duplex ultrasound of the venous system to ensure that DVT or reflux is not present.  The patient will follow-up with me after the ultrasound.     Current Outpatient Medications on File Prior to Visit  Medication Sig Dispense Refill   Azelastine HCl 137 MCG/SPRAY SOLN SMARTSIG:1-2 Spray(s) Both Nares Twice Daily     Calcium Carbonate-Vit D-Min (CALCIUM 1200 PO) Take by mouth.     cetirizine (ZYRTEC) 10 MG tablet Take 10 mg by mouth daily.     cholecalciferol (VITAMIN D) 1000 units tablet Take 1,000 Units by mouth daily.     ibuprofen (ADVIL,MOTRIN) 600 MG tablet Take 600 mg by mouth every 6 (six) hours as needed.     Magnesium 200 MG TABS Take 1 tablet by mouth daily.     methocarbamol (ROBAXIN) 500 MG tablet methocarbamol 500 mg tablet  Take 1 tablet twice a day by oral route as needed for 30 days.     Multiple Vitamin (MULTIVITAMIN) tablet Take 1 tablet by mouth daily.     NASACORT ALLERGY 24HR 55 MCG/ACT AERO nasal inhaler 2 sprays daily.     predniSONE (DELTASONE) 5 MG tablet prednisone 5 mg tablets in a dose pack  Take 1 dose pk by oral route.     triamcinolone ointment (KENALOG) 0.5 % Apply 1 application topically 2 (two) times daily. 30 g 1   No current facility-administered medications on file prior to visit.    There are no  Patient Instructions on file for this visit. No follow-ups on file.   Kris Hartmann, NP

## 2021-10-28 ENCOUNTER — Telehealth (INDEPENDENT_AMBULATORY_CARE_PROVIDER_SITE_OTHER): Payer: Self-pay | Admitting: Nurse Practitioner

## 2021-10-28 NOTE — Telephone Encounter (Signed)
ATC pt TCB and schedule f/u appt from 11.10.22 -   pt conv. biL venous reflux. see gs/fb

## 2021-11-14 ENCOUNTER — Encounter: Payer: Self-pay | Admitting: Gastroenterology

## 2021-11-14 ENCOUNTER — Ambulatory Visit (INDEPENDENT_AMBULATORY_CARE_PROVIDER_SITE_OTHER): Payer: 59 | Admitting: Gastroenterology

## 2021-11-14 VITALS — BP 150/61 | HR 94 | Temp 97.9°F | Ht 63.0 in | Wt 185.2 lb

## 2021-11-14 DIAGNOSIS — K5904 Chronic idiopathic constipation: Secondary | ICD-10-CM

## 2021-11-14 DIAGNOSIS — R1013 Epigastric pain: Secondary | ICD-10-CM | POA: Diagnosis not present

## 2021-11-14 DIAGNOSIS — R1032 Left lower quadrant pain: Secondary | ICD-10-CM | POA: Diagnosis not present

## 2021-11-14 DIAGNOSIS — G8929 Other chronic pain: Secondary | ICD-10-CM

## 2021-11-14 NOTE — Patient Instructions (Signed)
High-Fiber Eating Plan °Fiber, also called dietary fiber, is a type of carbohydrate. It is found foods such as fruits, vegetables, whole grains, and beans. A high-fiber diet can have many health benefits. Your health care provider may recommend a high-fiber diet to help: °Prevent constipation. Fiber can make your bowel movements more regular. °Lower your cholesterol. °Relieve the following conditions: °Inflammation of veins in the anus (hemorrhoids). °Inflammation of specific areas of the digestive tract (uncomplicated diverticulosis). °A problem of the large intestine, also called the colon, that sometimes causes pain and diarrhea (irritable bowel syndrome, or IBS). °Prevent overeating as part of a weight-loss plan. °Prevent heart disease, type 2 diabetes, and certain cancers. °What are tips for following this plan? °Reading food labels ° °Check the nutrition facts label on food products for the amount of dietary fiber. Choose foods that have 5 grams of fiber or more per serving. °The goals for recommended daily fiber intake include: °Men (age 50 or younger): 34-38 g. °Men (over age 50): 28-34 g. °Women (age 50 or younger): 25-28 g. °Women (over age 50): 22-25 g. °Your daily fiber goal is _____________ g. °Shopping °Choose whole fruits and vegetables instead of processed forms, such as apple juice or applesauce. °Choose a wide variety of high-fiber foods such as avocados, lentils, oats, and kidney beans. °Read the nutrition facts label of the foods you choose. Be aware of foods with added fiber. These foods often have high sugar and sodium amounts per serving. °Cooking °Use whole-grain flour for baking and cooking. °Cook with brown rice instead of white rice. °Meal planning °Start the day with a breakfast that is high in fiber, such as a cereal that contains 5 g of fiber or more per serving. °Eat breads and cereals that are made with whole-grain flour instead of refined flour or white flour. °Eat brown rice, bulgur  wheat, or millet instead of white rice. °Use beans in place of meat in soups, salads, and pasta dishes. °Be sure that half of the grains you eat each day are whole grains. °General information °You can get the recommended daily intake of dietary fiber by: °Eating a variety of fruits, vegetables, grains, nuts, and beans. °Taking a fiber supplement if you are not able to take in enough fiber in your diet. It is better to get fiber through food than from a supplement. °Gradually increase how much fiber you consume. If you increase your intake of dietary fiber too quickly, you may have bloating, cramping, or gas. °Drink plenty of water to help you digest fiber. °Choose high-fiber snacks, such as berries, raw vegetables, nuts, and popcorn. °What foods should I eat? °Fruits °Berries. Pears. Apples. Oranges. Avocado. Prunes and raisins. Dried figs. °Vegetables °Sweet potatoes. Spinach. Kale. Artichokes. Cabbage. Broccoli. Cauliflower. Green peas. Carrots. Squash. °Grains °Whole-grain breads. Multigrain cereal. Oats and oatmeal. Brown rice. Barley. Bulgur wheat. Millet. Quinoa. Bran muffins. Popcorn. Rye wafer crackers. °Meats and other proteins °Navy beans, kidney beans, and pinto beans. Soybeans. Split peas. Lentils. Nuts and seeds. °Dairy °Fiber-fortified yogurt. °Beverages °Fiber-fortified soy milk. Fiber-fortified orange juice. °Other foods °Fiber bars. °The items listed above may not be a complete list of recommended foods and beverages. Contact a dietitian for more information. °What foods should I avoid? °Fruits °Fruit juice. Cooked, strained fruit. °Vegetables °Fried potatoes. Canned vegetables. Well-cooked vegetables. °Grains °White bread. Pasta made with refined flour. White rice. °Meats and other proteins °Fatty cuts of meat. Fried chicken or fried fish. °Dairy °Milk. Yogurt. Cream cheese. Sour cream. °Fats and   oils °Butters. °Beverages °Soft drinks. °Other foods °Cakes and pastries. °The items listed above may  not be a complete list of foods and beverages to avoid. Talk with your dietitian about what choices are best for you. °Summary °Fiber is a type of carbohydrate. It is found in foods such as fruits, vegetables, whole grains, and beans. °A high-fiber diet has many benefits. It can help to prevent constipation, lower blood cholesterol, aid weight loss, and reduce your risk of heart disease, diabetes, and certain cancers. °Increase your intake of fiber gradually. Increasing fiber too quickly may cause cramping, bloating, and gas. Drink plenty of water while you increase the amount of fiber you consume. °The best sources of fiber include whole fruits and vegetables, whole grains, nuts, seeds, and beans. °This information is not intended to replace advice given to you by your health care provider. Make sure you discuss any questions you have with your health care provider. °Document Revised: 03/29/2020 Document Reviewed: 03/29/2020 °Elsevier Patient Education © 2022 [employer] ° °

## 2021-11-14 NOTE — Progress Notes (Signed)
Marilyn Darby, MD [address]  [place]  [place], [place] [address]  Main: [phone]  Fax: [phone]    Gastroenterology Consultation  Referring Provider:     Gae Dry, MD Primary Care Physician:  Valerie Roys, DO Primary Gastroenterologist:  Dr. Cephas Francis Reason for Consultation:     Chronic left lower quadrant pain, chronic constipation        HPI:   Jessalyn Hinojosa is a 40 y.o. female referred by Dr. Wynetta Emery, Barb Merino, DO  for consultation & management of chronic left lower quadrant pain that has started in July.  It was sudden, sharp and was concentrated mostly in left flank, underwent CT renal stone study by her PCP, which revealed multiple punctate bilateral intrarenal calculi, no evidence of hydronephrosis or obstructive uropathy.  CT revealed possible uterine fibroids, was evaluated by OB/GYN, Dr. Kenton Kingfisher, underwent complete ultrasound pelvis which revealed right ovarian cyst and leiomyoma.  There was no evidence of uterine fibroids.  Therefore, patient is referred to GI for further evaluation.  Since July, patient has been experiencing chronic left lower quadrant discomfort associated with abdominal bloating.  She does have history of severe constipation, irregular bowel habits, about once a week or every 2 weeks associated with significant straining, hard stools and severe abdominal bloating. Patient denies any rectal bleeding.  However, she does report early satiety, upper abdominal discomfort which is predominantly postprandial.  Labs are unrevealing including CBC, CMP, TSH.  She thinks her diet is devoid of fiber and does not drink adequate amount of water Patient does multiple jobs and denies any stress at her work  NSAIDs: None  Antiplts/Anticoagulants/Anti thrombotics: None  GI Procedures: None She denies family history of GI malignancy  Past Medical History:  Diagnosis Date   Allergic rhinitis    Bipolar 2 disorder ([place])     Past  Surgical History:  Procedure Laterality Date   birth mark removal     DENTAL SURGERY      Current Outpatient Medications:    Azelastine HCl 137 MCG/SPRAY SOLN, SMARTSIG:1-2 Spray(s) Both Nares Twice Daily, Disp: , Rfl:    Calcium Carbonate-Vit D-Min (CALCIUM 1200 PO), Take by mouth., Disp: , Rfl:    cetirizine (ZYRTEC) 10 MG tablet, Take 10 mg by mouth daily., Disp: , Rfl:    cholecalciferol (VITAMIN D) 1000 units tablet, Take 1,000 Units by mouth daily., Disp: , Rfl:    ibuprofen (ADVIL,MOTRIN) 600 MG tablet, Take 600 mg by mouth every 6 (six) hours as needed., Disp: , Rfl:    Magnesium 200 MG TABS, Take 1 tablet by mouth daily., Disp: , Rfl:    Multiple Vitamin (MULTIVITAMIN) tablet, Take 1 tablet by mouth daily., Disp: , Rfl:    NASACORT ALLERGY 24HR 55 MCG/ACT AERO nasal inhaler, 2 sprays daily., Disp: , Rfl:    triamcinolone ointment (KENALOG) 0.5 %, Apply 1 application topically 2 (two) times daily., Disp: 30 g, Rfl: 1    Family History  Problem Relation Age of Onset   Hypertension Mother    Hypertension Father    Breast cancer Maternal Grandmother    Heart disease Paternal Grandmother    Cancer Paternal Grandfather        Colon     Social History   Tobacco Use   Smoking status: Never   Smokeless tobacco: Never  Vaping Use   Vaping Use: Never used  Substance Use Topics   Alcohol use: Yes    Alcohol/week: 10.0  standard drinks    Types: 10 Standard drinks or equivalent per week   Drug use: No    Allergies as of 11/14/2021   (No Known Allergies)    Review of Systems:    All systems reviewed and negative except where noted in HPI.   Physical Exam:  BP (!) 150/61 (BP Location: Right Arm, Patient Position: Sitting, Cuff Size: Normal)   Pulse 94   Temp 97.9 F (36.6 C) (Oral)   Ht 5\' 3"  (1.6 m)   Wt 185 lb 3.2 oz (84 kg)   BMI 32.81 kg/m  No LMP recorded.  General:   Alert,  Well-developed, well-nourished, pleasant and cooperative in NAD Head:   Normocephalic and atraumatic. Eyes:  Sclera clear, no icterus.   Conjunctiva pink. Ears:  Normal auditory acuity. Nose:  No deformity, discharge, or lesions. Mouth:  No deformity or lesions,oropharynx pink & moist. Neck:  Supple; no masses or thyromegaly. Lungs:  Respirations even and unlabored.  Clear throughout to auscultation.   No wheezes, crackles, or rhonchi. No acute distress. Heart:  Regular rate and rhythm; no murmurs, clicks, rubs, or gallops. Abdomen:  Normal bowel sounds. Soft, non-tender and non-distended without masses, hepatosplenomegaly or hernias noted.  No guarding or rebound tenderness.   Rectal: Not performed Msk:  Symmetrical without gross deformities. Good, equal movement & strength bilaterally. Pulses:  Normal pulses noted. Extremities:  No clubbing or edema.  No cyanosis. Neurologic:  Alert and oriented x3;  grossly normal neurologically. Skin:  Intact without significant lesions or rashes. No jaundice. Psych:  Alert and cooperative. Normal mood and affect.  Imaging Studies: Reviewed  Assessment and Plan:   Kemani Heidel is a 40 y.o. pleasant female with chronic left lower quadrant pain associated with constipation and abdominal bloating.  She does have mild dyspepsia symptoms  Dyspepsia Check H. pylori breath test and treat if positive  Chronic left lower quadrant pain associated with constipation and abdominal bloating: Consistent with constipation predominant IBS Discussed about high-fiber diet, information provided Adequate intake of water Trial of Linzess 145 MCG daily, samples provided  If her symptoms are persisting, will proceed with colonoscopy Patient will contact me via MyChart   Follow up via MyChart   Marilyn Darby, MD

## 2021-11-15 LAB — H. PYLORI BREATH TEST: H pylori Breath Test: NEGATIVE

## 2021-11-18 NOTE — Progress Notes (Signed)
Office Visit Note  Patient: Marilyn Francis             Date of Birth: 08/09/1981           MRN: [id number]             PCP: Valerie Roys, DO Referring: Valerie Roys, DO Visit Date: 11/19/2021 Occupation: Varied  Subjective:  New Patient (Initial Visit) (Total body pain)   History of Present Illness: Marilyn Francis is a 40 y.o. female here for evaluation of joint pains and fatigue and lower extremity edema.  She has very longstanding history of chronic fatigue and body pains in multiple areas going back to teenage years.  Pain is most commonly more over the muscle groups worst in the neck back and bilateral upper extremities.  She occasionally experiences radiating numbness going down to the arms with occasional unexpected dropping items.  Past nerve conduction testing was normal. Usually there is not any significant swelling or obvious inflammatory changes in affected areas.  She does have chronic eczema this has improved partially compared to when she was a child and occurs on various places most recently on the face.  She sometimes gets a partial benefit with oral NSAIDs.   She suffered COVID infection in May of this year after that time she has been noticing bilateral lower extremity edema.  She was hospitalized in July with NSTEMI findings. Stress test, EKG, cardiac MRI findings nonspecific lab testing so far negative. She had negative inflammatory markers or autoantibody tests. She saw vascular surgery for the peripheral edema symptoms, currently using compression socks and conservative treatment with plan for venous ultrasound study coming up. He has very chronic fatigue describes poor sleep quality with delayed sleep and frequent awakenings.  She is not sure of exact cause for this and thinks it is probably positional discomfort does not have to frequently go to the bathroom.  She has longstanding irritable bowel syndrome with constipation predominant symptoms.  She often  experiences attention or concentration difficulty forgetting short-term memories which has been a chronic symptom. She frequently has all over sensitivity to light pressure.  Labs reviewed 09/2021 ANA neg SSA, SSB neg RF neg CCP neg ESR 2 CRP 1 CK 63 Uric acid 4.7 TSH T3 T4 wnl CBC wnl CMP wnl Vit D 32.4  06/2021 UA normal  Activities of Daily Living:  Patient reports morning stiffness for 24 hours.   Patient Reports nocturnal pain.  Difficulty dressing/grooming: Reports Difficulty climbing stairs: Reports Difficulty getting out of chair: Reports Difficulty using hands for taps, buttons, cutlery, and/or writing: Reports  Review of Systems  Constitutional:  Positive for fatigue.  HENT:  Positive for mouth dryness.   Eyes:  Negative for dryness.  Respiratory:  Positive for shortness of breath.   Cardiovascular:  Positive for swelling in legs/feet.  Gastrointestinal:  Positive for constipation and diarrhea.  Endocrine: Negative for excessive thirst.  Genitourinary:  Negative for difficulty urinating.  Musculoskeletal:  Positive for joint pain, gait problem, joint pain, muscle weakness, morning stiffness and muscle tenderness.  Skin:  Positive for rash.  Allergic/Immunologic: Negative for susceptible to infections.  Neurological:  Positive for numbness and weakness.  Hematological:  Positive for bruising/bleeding tendency.  Psychiatric/Behavioral:  Positive for sleep disturbance.    PMFS History:  Patient Active Problem List   Diagnosis Date Noted   Other fatigue 11/19/2021   Myalgia 11/19/2021   NSTEMI (non-ST elevated myocardial infarction) ([place]) 06/11/2021   Lower abdominal pain 06/11/2021  Chronic neck pain 12/17/2020   Chronic cough 11/05/2020   Irritable bowel syndrome with constipation 06/07/2020   Eczema 06/07/2020   Bipolar 2 disorder ([place])    Allergic rhinitis     Past Medical History:  Diagnosis Date   Allergic rhinitis    Bipolar 2 disorder ([place])      Family History  Problem Relation Age of Onset   Hypertension Mother    Hypertension Father    Breast cancer Maternal Grandmother    Heart disease Paternal Grandmother    Cancer Paternal Grandfather        Colon   Past Surgical History:  Procedure Laterality Date   birth mark removal     DENTAL SURGERY     Social History   Social History Narrative   Not on file   Immunization History  Administered Date(s) Administered   DTaP 08/10/1980, 10/19/1980, 01/09/1981, 08/01/1982, 01/30/1986   IPV 08/10/1980, 10/19/1980, 01/09/1981, 08/01/1982, 01/30/1986   MMR 09/18/1981, 05/10/2009   PFIZER(Purple Top)SARS-COV-2 Vaccination 02/08/2020, 02/29/2020, 09/07/2020   Tdap 05/10/2009, 06/07/2020     Objective: Vital Signs: BP 123/76 (BP Location: Right Arm, Patient Position: Sitting, Cuff Size: Normal)   Pulse 80   Resp 15   Ht '5\' 3"'  (1.6 m)   Wt 186 lb (84.4 kg)   BMI 32.95 kg/m    Physical Exam HENT:     Right Ear: External ear normal.     Left Ear: External ear normal.     Mouth/Throat:     Mouth: Mucous membranes are moist.     Pharynx: Oropharynx is clear.  Eyes:     Conjunctiva/sclera: Conjunctivae normal.  Cardiovascular:     Rate and Rhythm: Normal rate and regular rhythm.  Pulmonary:     Effort: Pulmonary effort is normal.     Breath sounds: Normal breath sounds.  Musculoskeletal:     Right lower leg: No edema.     Left lower leg: No edema.  Skin:    General: Skin is warm and dry.     Comments: Faint erythematous rash above right eye  Neurological:     General: No focal deficit present.     Mental Status: She is alert.     Deep Tendon Reflexes: Reflexes normal.  Psychiatric:        Mood and Affect: Mood normal.     Musculoskeletal Exam:  Neck full ROM posterior muscle tenderness Shoulders full ROM no tenderness or swelling Elbows full ROM no tenderness or swelling Wrists full ROM no tenderness or swelling Fingers full ROM no tenderness or  swelling Diffuse back muscle tenderness to pressure Lateral hip tenderness to palpation, right hip internal rotation slightly less than left Knees full ROM no tenderness or swelling Ankles full ROM no tenderness or swelling   Investigation: No additional findings.  Imaging: No results found.  Recent Labs: Lab Results  Component Value Date   WBC 5.8 09/25/2021   HGB 12.7 09/25/2021   PLT 199 09/25/2021   NA 142 09/25/2021   K 4.6 09/25/2021   CL 105 09/25/2021   CO2 22 09/25/2021   GLUCOSE 81 09/25/2021   BUN 13 09/25/2021   CREATININE 0.86 09/25/2021   BILITOT 0.5 09/25/2021   ALKPHOS 66 09/25/2021   AST 15 09/25/2021   ALT 5 09/25/2021   PROT 6.5 09/25/2021   ALBUMIN 4.2 09/25/2021   CALCIUM 9.2 09/25/2021   GFRAA 82 06/07/2020    Speciality Comments: No specialty comments available.  Procedures:  No procedures  performed Allergies: Patient has no known allergies.   Assessment / Plan:     Visit Diagnoses: Myalgia  Diffuse muscle pains and sensitivity no objective changes or strength deficits on examination. She describes many features typical for fibromyalgia syndrome as well I recommend she review material on this and self management strategies. Discussed importance of continuing physical activity especially ROM exercises. She might be a candidate for trying SNRI medication no specific history of manic episodes. Low suspicion of vasculitis, arthritis, or myositis problem with normal labs and exam so no additional testing or empiric DMARD treatment recommended.  Other fatigue  Longstanding fatigue labs were okay her sleep quality sounds very poor. I recommended a sleep study would be an option but most helpful in ruling out apnea. She does not tolerate muscle relaxant medication in the past low relief and high grogginess into the next day.  Orders: No orders of the defined types were placed in this encounter.  No orders of the defined types were placed in this  encounter.    Follow-Up Instructions: No follow-ups on file.   Collier Salina, MD  Note - This record has been created using [employer].  Chart creation errors have been sought, but may not always  have been located. Such creation errors do not reflect on  the standard of medical care.

## 2021-11-19 ENCOUNTER — Other Ambulatory Visit: Payer: Self-pay

## 2021-11-19 ENCOUNTER — Ambulatory Visit (INDEPENDENT_AMBULATORY_CARE_PROVIDER_SITE_OTHER): Payer: 59 | Admitting: Internal Medicine

## 2021-11-19 ENCOUNTER — Encounter: Payer: Self-pay | Admitting: Internal Medicine

## 2021-11-19 VITALS — BP 123/76 | HR 80 | Resp 15 | Ht 63.0 in | Wt 186.0 lb

## 2021-11-19 DIAGNOSIS — M791 Myalgia, unspecified site: Secondary | ICD-10-CM | POA: Diagnosis not present

## 2021-11-19 DIAGNOSIS — R5383 Other fatigue: Secondary | ICD-10-CM

## 2021-11-19 NOTE — Patient Instructions (Addendum)
I do not see any current evidence of an inflammatory process at this time.  I recommend checking out the [facility] patient-centered guide for fibromyalgia and chronic pain management: [email or web address]

## 2021-11-26 ENCOUNTER — Other Ambulatory Visit (INDEPENDENT_AMBULATORY_CARE_PROVIDER_SITE_OTHER): Payer: Self-pay | Admitting: Nurse Practitioner

## 2021-11-26 DIAGNOSIS — M7989 Other specified soft tissue disorders: Secondary | ICD-10-CM

## 2021-11-27 ENCOUNTER — Other Ambulatory Visit: Payer: Self-pay

## 2021-11-27 ENCOUNTER — Encounter (INDEPENDENT_AMBULATORY_CARE_PROVIDER_SITE_OTHER): Payer: Self-pay | Admitting: Nurse Practitioner

## 2021-11-27 ENCOUNTER — Ambulatory Visit (INDEPENDENT_AMBULATORY_CARE_PROVIDER_SITE_OTHER): Payer: 59 | Admitting: Nurse Practitioner

## 2021-11-27 ENCOUNTER — Ambulatory Visit (INDEPENDENT_AMBULATORY_CARE_PROVIDER_SITE_OTHER): Payer: 59

## 2021-11-27 VITALS — BP 151/75 | HR 74 | Resp 16 | Wt 190.0 lb

## 2021-11-27 DIAGNOSIS — L97519 Non-pressure chronic ulcer of other part of right foot with unspecified severity: Secondary | ICD-10-CM

## 2021-11-27 DIAGNOSIS — I890 Lymphedema, not elsewhere classified: Secondary | ICD-10-CM

## 2021-12-07 ENCOUNTER — Encounter (INDEPENDENT_AMBULATORY_CARE_PROVIDER_SITE_OTHER): Payer: Self-pay | Admitting: Nurse Practitioner

## 2021-12-07 NOTE — Progress Notes (Signed)
Subjective:    Patient ID: Marilyn Francis, female    DOB: [birth year], 40 y.o.   MRN: [id number] Chief Complaint  Patient presents with   Follow-up    Ultrasound follow up    Marilyn Francis is a 40 year old female that is here for evaluation of lower extremity edema.  The patient recently had COVID in July and was subsequently hospitalized.  Since that time the patient has not been at her full level of activity and has noticed swelling since that time.  She is utilizing medical grade compression stockings with little significant difference in swelling.  She has been utilizing conservative therapy as well as elevation for well over 4 weeks.  There have been no open wounds or ulcerations.   Review of Systems  Cardiovascular:  Positive for leg swelling.  All other systems reviewed and are negative.     Objective:   Physical Exam Vitals reviewed.  HENT:     Head: Normocephalic.  Cardiovascular:     Rate and Rhythm: Normal rate.  Pulmonary:     Effort: Pulmonary effort is normal.  Musculoskeletal:     Right lower leg: 2+ Edema present.     Left lower leg: 2+ Edema present.  Skin:    General: Skin is warm and dry.     Comments: Dermal thickening bilaterally   Neurological:     Mental Status: She is alert and oriented to person, place, and time.  Psychiatric:        Mood and Affect: Mood normal.        Behavior: Behavior normal.        Thought Content: Thought content normal.        Judgment: Judgment normal.    BP (!) 151/75 (BP Location: Right Arm)    Pulse 74    Resp 16    Wt 190 lb (86.2 kg)    BMI 33.66 kg/m   Past Medical History:  Diagnosis Date   Allergic rhinitis    Bipolar 2 disorder (HCC)     Social History   Socioeconomic History   Marital status: Married    Spouse name: Not on file   Number of children: Not on file   Years of education: Not on file   Highest education level: Not on file  Occupational History   Not on file  Tobacco Use    Smoking status: Never   Smokeless tobacco: Never  Vaping Use   Vaping Use: Never used  Substance and Sexual Activity   Alcohol use: Yes    Alcohol/week: 10.0 standard drinks    Types: 10 Standard drinks or equivalent per week   Drug use: No   Sexual activity: Yes    Birth control/protection: None  Other Topics Concern   Not on file  Social History Narrative   Not on file   Social Determinants of Health   Financial Resource Strain: Not on file  Food Insecurity: Not on file  Transportation Needs: Not on file  Physical Activity: Not on file  Stress: Not on file  Social Connections: Not on file  Intimate Partner Violence: Not on file    Past Surgical History:  Procedure Laterality Date   birth mark removal     DENTAL SURGERY      Family History  Problem Relation Age of Onset   Hypertension Mother    Hypertension Father    Breast cancer Maternal Grandmother    Heart disease Paternal Grandmother    Cancer Paternal Grandfather  Colon    No Known Allergies  CBC Latest Ref Rng & Units 09/25/2021 06/11/2021 06/07/2020  WBC 3.4 - 10.8 x10E3/uL 5.8 6.9 4.3  Hemoglobin 11.1 - 15.9 g/dL 12.7 13.5 13.5  Hematocrit 34.0 - 46.6 % 39.0 40.5 40.0  Platelets 150 - 450 x10E3/uL 199 210 202      CMP     Component Value Date/Time   NA 142 09/25/2021 0920   K 4.6 09/25/2021 0920   CL 105 09/25/2021 0920   CO2 22 09/25/2021 0920   GLUCOSE 81 09/25/2021 0920   BUN 13 09/25/2021 0920   CREATININE 0.86 09/25/2021 0920   CALCIUM 9.2 09/25/2021 0920   PROT 6.5 09/25/2021 0920   ALBUMIN 4.2 09/25/2021 0920   AST 15 09/25/2021 0920   ALT 5 09/25/2021 0920   ALKPHOS 66 09/25/2021 0920   BILITOT 0.5 09/25/2021 0920   GFRNONAA 72 06/07/2020 1053   GFRAA 82 06/07/2020 1053     No results found.     Assessment & Plan:   1. Lymphedema Recommend:  No surgery or intervention at this point in time.    I have reviewed my previous discussion with the patient  regarding swelling and why it causes symptoms.  Patient will continue wearing graduated compression stockings class 1 (20-30 mmHg) on a daily basis. The patient will  beginning wearing the stockings first thing in the morning and removing them in the evening. The patient is instructed specifically not to sleep in the stockings.    In addition, behavioral modification including several periods of elevation of the lower extremities during the day will be continued.  This was reviewed with the patient during the initial visit.  The patient will also continue routine exercise, especially walking on a daily basis as was discussed during the initial visit.    Despite conservative treatments of at least 4 weeks,  including graduated compression therapy class 1 and behavioral modification including exercise and elevation the patient  has not obtained adequate control of the lymphedema.  The patient still has stage 3 lymphedema and therefore, I believe that a lymph pump should be added to improve the control of the patient's lymphedema.  Additionally, a lymph pump is warranted because it will reduce the risk of cellulitis and ulceration in the future.  Patient should follow-up in six months   - VAS [place] LOWER EXTREMITY VENOUS REFLUX   Current Outpatient Medications on File Prior to Visit  Medication Sig Dispense Refill   Azelastine HCl 137 MCG/SPRAY SOLN SMARTSIG:1-2 Spray(s) Both Nares Twice Daily     Calcium Carbonate-Vit D-Min (CALCIUM 1200 PO) Take by mouth.     cetirizine (ZYRTEC) 10 MG tablet Take 10 mg by mouth daily.     cholecalciferol (VITAMIN D) 1000 units tablet Take 1,000 Units by mouth daily.     ibuprofen (ADVIL,MOTRIN) 600 MG tablet Take 600 mg by mouth every 6 (six) hours as needed.     Magnesium 200 MG TABS Take 1 tablet by mouth daily.     Multiple Vitamin (MULTIVITAMIN) tablet Take 1 tablet by mouth daily.     NASACORT ALLERGY 24HR 55 MCG/ACT AERO nasal inhaler 2 sprays daily.      triamcinolone ointment (KENALOG) 0.5 % Apply 1 application topically 2 (two) times daily. 30 g 1   No current facility-administered medications on file prior to visit.    There are no Patient Instructions on file for this visit. No follow-ups on file.   Kris Hartmann, NP

## 2021-12-18 ENCOUNTER — Ambulatory Visit: Payer: 59 | Admitting: Internal Medicine

## 2022-01-16 ENCOUNTER — Encounter: Payer: Self-pay | Admitting: Family Medicine

## 2022-01-16 ENCOUNTER — Ambulatory Visit: Payer: Self-pay | Admitting: Family Medicine

## 2022-01-16 ENCOUNTER — Other Ambulatory Visit: Payer: Self-pay

## 2022-01-16 DIAGNOSIS — Z113 Encounter for screening for infections with a predominantly sexual mode of transmission: Secondary | ICD-10-CM

## 2022-01-16 LAB — WET PREP FOR TRICH, YEAST, CLUE
Trichomonas Exam: NEGATIVE
Yeast Exam: NEGATIVE

## 2022-01-16 NOTE — Progress Notes (Signed)
[facility] Department  STI clinic/screening visit [place] [address] [phone]  Subjective:  Marilyn Francis is a 41 y.o. female being seen today for an STI screening visit. The patient reports they do not have symptoms.  Patient reports that they do not desire a pregnancy in the next year.   They reported they are not interested in discussing contraception today.    Patient's last menstrual period was 01/14/2022 (exact date).   Patient has the following medical conditions:   Patient Active Problem List   Diagnosis Date Noted   Other fatigue 11/19/2021   Myalgia 11/19/2021   NSTEMI (non-ST elevated myocardial infarction) ([place]) 06/11/2021   Lower abdominal pain 06/11/2021   Chronic neck pain 12/17/2020   Chronic cough 11/05/2020   Irritable bowel syndrome with constipation 06/07/2020   Eczema 06/07/2020   Bipolar 2 disorder ([place])    Allergic rhinitis     Chief Complaint  Patient presents with   SEXUALLY TRANSMITTED DISEASE    screening    HPI  Patient reports here for screenings, denies s/sx   Last HIV test per patient/review of record was 09/06/2021 Patient reports last pap was 08/02/2021.   Screening for MPX risk: Does the patient have an unexplained rash? No Is the patient MSM? No Does the patient endorse multiple sex partners or anonymous sex partners? No Did the patient have close or sexual contact with a person diagnosed with MPX? No Has the patient traveled outside the [place] where MPX is endemic? No Is there a high clinical suspicion for MPX-- evidenced by one of the following No  -Unlikely to be chickenpox  -Lymphadenopathy  -Rash that present in same phase of evolution on any given body part See flowsheet for further details and programmatic requirements.    The following portions of the patient's history were reviewed and updated as appropriate: allergies, current medications, past medical history, past social  history, past surgical history and problem list.  Objective:  There were no vitals filed for this visit.  Physical Exam Vitals and nursing note reviewed.  Constitutional:      Appearance: Normal appearance.  HENT:     Head: Normocephalic and atraumatic.     Mouth/Throat:     Mouth: Mucous membranes are moist.     Pharynx: Oropharynx is clear. No oropharyngeal exudate or posterior oropharyngeal erythema.  Pulmonary:     Effort: Pulmonary effort is normal.  Abdominal:     General: Abdomen is flat.     Palpations: There is no mass.     Tenderness: There is no abdominal tenderness. There is no rebound.  Genitourinary:    Exam position: Lithotomy position.     Pubic Area: No rash or pubic lice.      Labia:        Right: No rash or lesion.        Left: No rash or lesion.      Vagina: Normal. No vaginal discharge, erythema, bleeding or lesions.     Cervix: No cervical motion tenderness, discharge, friability, lesion or erythema.     Uterus: Normal.      Adnexa: Right adnexa normal and left adnexa normal.     Comments: Deferred, pt self collected  Lymphadenopathy:     Head:     Right side of head: No preauricular or posterior auricular adenopathy.     Left side of head: No preauricular or posterior auricular adenopathy.     Cervical: No cervical adenopathy.  Upper Body:     Right upper body: No supraclavicular or axillary adenopathy.     Left upper body: No supraclavicular or axillary adenopathy.     Lower Body: No right inguinal adenopathy. No left inguinal adenopathy.  Skin:    General: Skin is warm and dry.     Findings: No rash.  Neurological:     Mental Status: She is alert and oriented to person, place, and time.     Assessment and Plan:  Marilyn Francis is a 41 y.o. female presenting to the [facility] Department for STI screening  1. Screening examination for venereal disease Patient accepted all screenings including wet prep, oral, vaginal CT/GC and  bloodwork for HIV/RPR.  Patient meets criteria for HepB screening? No. Ordered? No - declined  Patient meets criteria for HepC screening? No. Ordered? No - declined   Wet prep results neg     No Treatment needed Discussed time line for [facility] results and that patient will be called with positive results and encouraged patient to call if she had not heard in 2 weeks.  Counseled to return or seek care for continued or worsening symptoms Recommended condom use with all sex  Patient is currently using  NO BCM   to prevent pregnancy.   - Chlamydia/Gonorrhea [facility] - WET PREP FOR [place], YEAST, CLUE - HIV [place] - Syphilis Serology, [facility] - Chlamydia/Gonorrhea [place] [facility]     No follow-ups on file.  Future Appointments  Date Time Provider [place]  03/13/2022  1:00 PM Valerie Roys, DO [facility]  05/28/2022  8:30 AM Kris Hartmann, NP [facility] None    Junious Dresser, FNP

## 2022-01-16 NOTE — Progress Notes (Signed)
Pt here for STD screening.  Wet mount results reviewed, no treatment required per SO.  Pt given condoms.  Windle Guard, RN

## 2022-02-17 ENCOUNTER — Other Ambulatory Visit: Payer: Self-pay

## 2022-02-17 ENCOUNTER — Encounter: Payer: Self-pay | Admitting: Obstetrics & Gynecology

## 2022-02-17 ENCOUNTER — Ambulatory Visit (INDEPENDENT_AMBULATORY_CARE_PROVIDER_SITE_OTHER): Payer: 59 | Admitting: Obstetrics & Gynecology

## 2022-02-17 VITALS — BP 140/90 | Wt 189.0 lb

## 2022-02-17 DIAGNOSIS — N939 Abnormal uterine and vaginal bleeding, unspecified: Secondary | ICD-10-CM

## 2022-02-17 MED ORDER — NORETHINDRONE 0.35 MG PO TABS: 1.0000 | ORAL_TABLET | Freq: Every day | ORAL | 1 refills | Status: DC

## 2022-02-17 NOTE — Progress Notes (Signed)
?HPI: ?     Marilyn Francis is a 41 y.o.  who LMP was Patient's last menstrual period was 02/17/2022., presents today for a problem visit.  She complains of  prolonged period  that  began several months ago and its severity is described as  lasting several weeks, stopped 3 weeks ago, then restarted today .   Seh feels it may have been triggered by use of a supplement DHEA, which she took for one month leading up to the start of this irreg period for her.    She has a h/o reg cycles most of the time.  She has a known small 3 cm fibroid (last [place] 2022).  No other sx's, other than weight gain.  Prior side effects to OCPs. ? ?PMHx: ?She  has a past medical history of Allergic rhinitis and Bipolar 2 disorder ([place]). Also,  has a past surgical history that includes birth mark removal and Dental surgery., family history includes Breast cancer in her maternal grandmother; Cancer in her paternal grandfather; Heart disease in her paternal grandmother; Hypertension in her father and mother.,  reports that she has never smoked. She has never used smokeless tobacco. She reports current alcohol use of about 10.0 standard drinks per week. She reports that she does not currently use drugs after having used the following drugs: Marijuana. ? ?She  ?Current Outpatient Medications:  ?  Azelastine HCl 137 MCG/SPRAY SOLN, SMARTSIG:1-2 Spray(s) Both Nares Twice Daily, Disp: , Rfl:  ?  Calcium Carbonate-Vit D-Min (CALCIUM 1200 PO), Take by mouth., Disp: , Rfl:  ?  cetirizine (ZYRTEC) 10 MG tablet, Take 10 mg by mouth daily., Disp: , Rfl:  ?  cholecalciferol (VITAMIN D) 1000 units tablet, Take 1,000 Units by mouth daily., Disp: , Rfl:  ?  ibuprofen (ADVIL,MOTRIN) 600 MG tablet, Take 600 mg by mouth every 6 (six) hours as needed., Disp: , Rfl:  ?  Magnesium 200 MG TABS, Take 1 tablet by mouth daily., Disp: , Rfl:  ?  Multiple Vitamin (MULTIVITAMIN) tablet, Take 1 tablet by mouth daily., Disp: , Rfl:  ?  NASACORT ALLERGY 24HR 55 MCG/ACT  AERO nasal inhaler, 2 sprays daily., Disp: , Rfl:  ?  norethindrone (MICRONOR) 0.35 MG tablet, Take 1 tablet (0.35 mg total) by mouth daily., Disp: 30 tablet, Rfl: 1 ?  triamcinolone ointment (KENALOG) 0.5 %, Apply 1 application topically 2 (two) times daily., Disp: 30 g, Rfl: 1  ?Also, has No Known Allergies. ? ?Review of Systems  ?All other systems reviewed and are negative. ? ?Objective: ?BP 140/90   Wt 189 lb (85.7 kg)   LMP 02/17/2022   BMI 33.48 kg/m?  ?Physical Exam ?Constitutional:   ?   General: She is not in acute distress. ?   Appearance: She is well-developed.  ?Musculoskeletal:     ?   General: Normal range of motion.  ?Neurological:  ?   Mental Status: She is alert and oriented to person, place, and time.  ?Skin: ?   General: Skin is warm and dry.  ?Vitals reviewed.  ? ? ?ASSESSMENT/PLAN:   ?  ICD-10-CM   ?1. Abnormal uterine bleeding  N93.9   ?  ?One month [place] then reassess pattern of bleeding thereafter.  Alternatives discussed.  Prefers minimal medicine therapy.  Agree to see how periods naturally resume after getting prior DHEA and now stabilizing norethindrone out of system.  Does not need additional birth control. ?Monitor weight.  ? ? ?Barnett Applebaum, MD, FACOG ?[facility],  [facility] ?02/17/2022  10:24 AM ? ?

## 2022-02-19 ENCOUNTER — Encounter: Payer: 59 | Admitting: Family Medicine

## 2022-02-21 ENCOUNTER — Other Ambulatory Visit: Payer: Self-pay

## 2022-02-21 ENCOUNTER — Ambulatory Visit (LOCAL_COMMUNITY_HEALTH_CENTER): Payer: 59

## 2022-02-21 DIAGNOSIS — Z23 Encounter for immunization: Secondary | ICD-10-CM

## 2022-02-21 DIAGNOSIS — Z719 Counseling, unspecified: Secondary | ICD-10-CM

## 2022-02-21 NOTE — Progress Notes (Signed)
?  Are you feeling sick today? No ? ? ?Have you ever received a dose of COVID-19 Vaccine? [employer], [place], [place], [place], Other) Yes ? ?If yes, which vaccine?  ?How many dose of COVID-19 vaccine were administered?   Pfizer primary series 2 doses, Pfizer monovalent booster 1 dose ? ? ?Did you bring the vaccination record card or other documentation?  Yes ? ? ?Does the person to be vaccinated have a health condition or is undergoing treatment that makes them moderately or severely immunocompromised? This would include, but not be limited to, treatment for cancer, HIV, receipt of organ transplant, immunosuppressive therapy or high-dose corticosteroids, CAR-T-cell therapy, hematopoietic cell transplant [HCT], or moderate or severe primary immunodeficiency.  No ? ?Has the person to be vaccinated received COVID-19 vaccine before or during hematopoietic cell transplant (HCT) or CAR-T-cell therapies? No ? ?Has the person to be vaccinated ever had an allergic reaction to: (This would include a severe allergic reaction [e.g., anaphylaxis] that required treatment with epinephrine or EpiPen? or that caused you to go to the hospital. It would also include an allergic reaction that caused hives, swelling, or respiratory distress, including wheezing.) A component of a COVID-19 vaccine or a previous dose of COVID-19 vaccine? No ? ? ?Has the person to be vaccinated ever had an allergic reaction to another vaccine (other thanCOVID-19 vaccine) or an injectable medication? (This would include a severe allergic reaction [e.g., anaphylaxis] that required treatment with epinephrine or EpiPen? or that caused youto go to the hospital. It would also include an allergic reaction that caused hives, swelling, or respiratory distress, including wheezing.)   No ?  ?Do you have a history of any of the following: ? ?Myocarditis or Pericarditis No ?Have a history of thrombosis with thrombocytopenia syndrome (TTS) No ?Multisystem Inflammatory  Syndrome (MIS-C or MIS-A)? No ?Immune-mediate syndrome defined by thrombosis and thrombocytopenia, such as heparin--induced thrombocytopenia (HIT)  No ?Have a history of Guillain-Barr? Syndrome (GBS) No ?Have a history of COVID-19 disease within the past 3 months? No ?Vaccinated with monkeypox vaccine in the last 4 weeks? No ? ?[facility] updated and copy given to patient. Ranae Palms, RN ? ?

## 2022-03-13 ENCOUNTER — Encounter: Payer: Self-pay | Admitting: Family Medicine

## 2022-03-13 ENCOUNTER — Ambulatory Visit (INDEPENDENT_AMBULATORY_CARE_PROVIDER_SITE_OTHER): Payer: 59 | Admitting: Family Medicine

## 2022-03-13 VITALS — BP 106/73 | HR 69 | Temp 98.4°F | Ht 63.0 in | Wt 187.0 lb

## 2022-03-13 DIAGNOSIS — H01131 Eczematous dermatitis of right upper eyelid: Secondary | ICD-10-CM | POA: Diagnosis not present

## 2022-03-13 DIAGNOSIS — Z0000 Encounter for general adult medical examination without abnormal findings: Secondary | ICD-10-CM | POA: Diagnosis not present

## 2022-03-13 DIAGNOSIS — Z1231 Encounter for screening mammogram for malignant neoplasm of breast: Secondary | ICD-10-CM | POA: Diagnosis not present

## 2022-03-13 MED ORDER — TRIAMCINOLONE ACETONIDE 0.5 % EX OINT: 1.0000 "application " | TOPICAL_OINTMENT | Freq: Two times a day (BID) | CUTANEOUS | 1 refills | Status: AC

## 2022-03-13 MED ORDER — BETAMETHASONE VALERATE 0.12 % EX FOAM: 1.0000 "application " | Freq: Two times a day (BID) | CUTANEOUS | 3 refills | Status: DC

## 2022-03-13 NOTE — Patient Instructions (Signed)
Please call to schedule your mammogram: [facility]  Address: [address], [place],  [address] Phone: [phone]  

## 2022-03-13 NOTE — Progress Notes (Signed)
? ?BP 106/73   Pulse 69   Temp 98.4 ?F (36.9 ?C)   Ht '5\' 3"'$  (1.6 m)   Wt 187 lb (84.8 kg)   LMP 02/17/2022 (Exact Date)   SpO2 99%   BMI 33.13 kg/m?   ? ?Subjective:  ? ? Patient ID: Marilyn Francis, female    DOB: February 22, 1981, 41 y.o.   MRN: [id number] ? ?HPI: ?Marilyn Francis is a 41 y.o. female presenting on 03/13/2022 for comprehensive medical examination. Current medical complaints include:none ? ?She currently lives with: wife ?Menopausal Symptoms: no ? ?Depression Screen done today and results listed below:  ? ?  03/13/2022  ?  1:08 PM 06/07/2020  ? 11:54 AM 03/27/2017  ?  2:54 PM  ?Depression screen PHQ 2/9  ?Decreased Interest 0 1 0  ?Down, Depressed, Hopeless 0 1 0  ?PHQ - 2 Score 0 2 0  ?Altered sleeping 1 1   ?Tired, decreased energy 1 3   ?Change in appetite 1 1   ?Feeling bad or failure about yourself  0 1   ?Trouble concentrating 1 2   ?Moving slowly or fidgety/restless 0 1   ?Suicidal thoughts 0 0   ?PHQ-9 Score 4 11   ?Difficult doing work/chores  Somewhat difficult   ? ?Past Medical History:  ?Past Medical History:  ?Diagnosis Date  ? Allergic rhinitis   ? Bipolar 2 disorder ([place])   ? ? ?Surgical History:  ?Past Surgical History:  ?Procedure Laterality Date  ? birth mark removal    ? DENTAL SURGERY    ? ? ?Medications:  ?Current Outpatient Medications on File Prior to Visit  ?Medication Sig  ? Azelastine HCl 137 MCG/SPRAY SOLN SMARTSIG:1-2 Spray(s) Both Nares Twice Daily  ? Calcium Carbonate-Vit D-Min (CALCIUM 1200 PO) Take by mouth.  ? cetirizine (ZYRTEC) 10 MG tablet Take 10 mg by mouth daily.  ? cholecalciferol (VITAMIN D) 1000 units tablet Take 1,000 Units by mouth daily.  ? ibuprofen (ADVIL,MOTRIN) 600 MG tablet Take 600 mg by mouth every 6 (six) hours as needed.  ? Magnesium 200 MG TABS Take 1 tablet by mouth daily.  ? Multiple Vitamin (MULTIVITAMIN) tablet Take 1 tablet by mouth daily.  ? NASACORT ALLERGY 24HR 55 MCG/ACT AERO nasal inhaler 2 sprays daily.  ? norethindrone (MICRONOR) 0.35 MG  tablet Take 1 tablet (0.35 mg total) by mouth daily. (Patient not taking: Reported on 03/13/2022)  ? ?No current facility-administered medications on file prior to visit.  ? ? ?Allergies:  ?No Known Allergies ? ?Social History:  ?Social History  ? ?Socioeconomic History  ? Marital status: Married  ?  Spouse name: Not on file  ? Number of children: Not on file  ? Years of education: Not on file  ? Highest education level: Not on file  ?Occupational History  ? Not on file  ?Tobacco Use  ? Smoking status: Never  ? Smokeless tobacco: Never  ?Vaping Use  ? Vaping Use: Never used  ?Substance and Sexual Activity  ? Alcohol use: Yes  ?  Alcohol/week: 10.0 standard drinks  ?  Types: 10 Standard drinks or equivalent per week  ? Drug use: Not Currently  ?  Types: Marijuana  ?  Comment: last used 10/2021  ? Sexual activity: Yes  ?  Birth control/protection: None, Condom  ?Other Topics Concern  ? Not on file  ?Social History Narrative  ? Not on file  ? ?Social Determinants of Health  ? ?Financial Resource Strain: Not on file  ?Food Insecurity:  Not on file  ?Transportation Needs: Not on file  ?Physical Activity: Not on file  ?Stress: Not on file  ?Social Connections: Not on file  ?Intimate Partner Violence: Not At Risk  ? Fear of Current or Ex-Partner: No  ? Emotionally Abused: No  ? Physically Abused: No  ? Sexually Abused: No  ? ?Social History  ? ?Tobacco Use  ?Smoking Status Never  ?Smokeless Tobacco Never  ? ?Social History  ? ?Substance and Sexual Activity  ?Alcohol Use Yes  ? Alcohol/week: 10.0 standard drinks  ? Types: 10 Standard drinks or equivalent per week  ? ? ?Family History:  ?Family History  ?Problem Relation Age of Onset  ? Hypertension Mother   ? Hypertension Father   ? Lymphoma Brother   ? Hodgkin's lymphoma Brother   ? Breast cancer Maternal Grandmother   ? Heart disease Paternal Grandmother   ? Cancer Paternal Grandfather   ?     Colon  ? ? ?Past medical history, surgical history, medications, allergies, family  history and social history reviewed with patient today and changes made to appropriate areas of the chart.  ? ?Review of Systems  ?Constitutional: Negative.   ?HENT: Negative.    ?Eyes: Negative.   ?Respiratory: Negative.    ?Cardiovascular:  Positive for leg swelling. Negative for chest pain, palpitations, orthopnea, claudication and PND.  ?Gastrointestinal:  Positive for abdominal pain, constipation and heartburn. Negative for blood in stool, diarrhea, melena, nausea and vomiting.  ?Genitourinary: Negative.   ?Musculoskeletal: Negative.   ?Skin:  Positive for rash. Negative for itching.  ?Neurological: Negative.   ?Endo/Heme/Allergies: Negative.   ?Psychiatric/Behavioral: Negative.    ?All other ROS negative except what is listed above and in the HPI.  ? ?   ?Objective:  ?  ?BP 106/73   Pulse 69   Temp 98.4 ?F (36.9 ?C)   Ht '5\' 3"'$  (1.6 m)   Wt 187 lb (84.8 kg)   LMP 02/17/2022 (Exact Date)   SpO2 99%   BMI 33.13 kg/m?   ?Wt Readings from Last 3 Encounters:  ?03/13/22 187 lb (84.8 kg)  ?02/17/22 189 lb (85.7 kg)  ?11/27/21 190 lb (86.2 kg)  ?  ?Physical Exam ?Vitals and nursing note reviewed.  ?Constitutional:   ?   General: She is not in acute distress. ?   Appearance: Normal appearance. She is not ill-appearing, toxic-appearing or diaphoretic.  ?HENT:  ?   Head: Normocephalic and atraumatic.  ?   Right Ear: Tympanic membrane, ear canal and external ear normal. There is no impacted cerumen.  ?   Left Ear: Tympanic membrane, ear canal and external ear normal. There is no impacted cerumen.  ?   Nose: Nose normal. No congestion or rhinorrhea.  ?   Mouth/Throat:  ?   Mouth: Mucous membranes are moist.  ?   Pharynx: Oropharynx is clear. No oropharyngeal exudate or posterior oropharyngeal erythema.  ?Eyes:  ?   General: No scleral icterus.    ?   Right eye: No discharge.     ?   Left eye: No discharge.  ?   Extraocular Movements: Extraocular movements intact.  ?   Conjunctiva/sclera: Conjunctivae normal.  ?    Pupils: Pupils are equal, round, and reactive to light.  ?Neck:  ?   Vascular: No carotid bruit.  ?Cardiovascular:  ?   Rate and Rhythm: Normal rate and regular rhythm.  ?   Pulses: Normal pulses.  ?   Heart sounds: No murmur heard. ?  No friction rub. No gallop.  ?Pulmonary:  ?   Effort: Pulmonary effort is normal. No respiratory distress.  ?   Breath sounds: Normal breath sounds. No stridor. No wheezing, rhonchi or rales.  ?Chest:  ?   Chest wall: No tenderness.  ?Abdominal:  ?   General: Abdomen is flat. Bowel sounds are normal. There is no distension.  ?   Palpations: Abdomen is soft. There is no mass.  ?   Tenderness: There is no abdominal tenderness. There is no right CVA tenderness, left CVA tenderness, guarding or rebound.  ?   Hernia: No hernia is present.  ?Genitourinary: ?   Comments: Breast and pelvic exams deferred with shared decision making ?Musculoskeletal:     ?   General: No swelling, tenderness, deformity or signs of injury. Normal range of motion.  ?   Cervical back: Normal range of motion and neck supple. No rigidity. No muscular tenderness.  ?   Right lower leg: No edema.  ?   Left lower leg: No edema.  ?Lymphadenopathy:  ?   Cervical: No cervical adenopathy.  ?Skin: ?   General: Skin is warm and dry.  ?   Capillary Refill: Capillary refill takes less than 2 seconds.  ?   Coloration: Skin is not jaundiced or pale.  ?   Findings: No bruising, erythema, lesion or rash.  ?Neurological:  ?   General: No focal deficit present.  ?   Mental Status: She is alert and oriented to person, place, and time. Mental status is at baseline.  ?   Cranial Nerves: No cranial nerve deficit.  ?   Sensory: No sensory deficit.  ?   Motor: No weakness.  ?   Coordination: Coordination normal.  ?   Gait: Gait normal.  ?   Deep Tendon Reflexes: Reflexes normal.  ?Psychiatric:     ?   Mood and Affect: Mood normal.     ?   Behavior: Behavior normal.     ?   Thought Content: Thought content normal.     ?   Judgment: Judgment  normal.  ? ? ?Results for orders placed or performed in visit on 01/16/22  ?WET PREP FOR [place], YEAST, CLUE  ?Result Value Ref Range  ? Trichomonas Exam Negative Negative  ? Yeast Exam Negative Negative  ? Clue Cell

## 2022-03-14 LAB — COMPREHENSIVE METABOLIC PANEL
ALT: 6 IU/L (ref 0–32)
AST: 17 IU/L (ref 0–40)
Albumin/Globulin Ratio: 1.8 (ref 1.2–2.2)
Albumin: 4.6 g/dL (ref 3.8–4.8)
Alkaline Phosphatase: 81 IU/L (ref 44–121)
BUN/Creatinine Ratio: 13 (ref 9–23)
BUN: 11 mg/dL (ref 6–24)
Bilirubin Total: 0.3 mg/dL (ref 0.0–1.2)
CO2: 26 mmol/L (ref 20–29)
Calcium: 9.4 mg/dL (ref 8.7–10.2)
Chloride: 102 mmol/L (ref 96–106)
Creatinine, Ser: 0.87 mg/dL (ref 0.57–1.00)
Globulin, Total: 2.5 g/dL (ref 1.5–4.5)
Glucose: 106 mg/dL — ABNORMAL HIGH (ref 70–99)
Potassium: 4.3 mmol/L (ref 3.5–5.2)
Sodium: 140 mmol/L (ref 134–144)
Total Protein: 7.1 g/dL (ref 6.0–8.5)
eGFR: 86 mL/min/{1.73_m2} (ref >59)

## 2022-03-14 LAB — LIPID PANEL W/O CHOL/HDL RATIO
Cholesterol, Total: 230 mg/dL — ABNORMAL HIGH (ref 100–199)
HDL: 105 mg/dL (ref >39)
LDL Chol Calc (NIH): 112 mg/dL — ABNORMAL HIGH (ref 0–99)
Triglycerides: 75 mg/dL (ref 0–149)
VLDL Cholesterol Cal: 13 mg/dL (ref 5–40)

## 2022-03-14 LAB — CBC WITH DIFFERENTIAL/PLATELET
Basophils Absolute: 0 10*3/uL (ref 0.0–0.2)
Basos: 0 %
EOS (ABSOLUTE): 0.1 10*3/uL (ref 0.0–0.4)
Eos: 2 %
Hematocrit: 37.6 % (ref 34.0–46.6)
Hemoglobin: 12.5 g/dL (ref 11.1–15.9)
Immature Grans (Abs): 0 10*3/uL (ref 0.0–0.1)
Immature Granulocytes: 0 %
Lymphocytes Absolute: 1.5 10*3/uL (ref 0.7–3.1)
Lymphs: 22 %
MCH: 31.6 pg (ref 26.6–33.0)
MCHC: 33.2 g/dL (ref 31.5–35.7)
MCV: 95 fL (ref 79–97)
Monocytes Absolute: 0.4 10*3/uL (ref 0.1–0.9)
Monocytes: 5 %
Neutrophils Absolute: 4.9 10*3/uL (ref 1.4–7.0)
Neutrophils: 71 %
Platelets: 227 10*3/uL (ref 150–450)
RBC: 3.95 x10E6/uL (ref 3.77–5.28)
RDW: 12.4 % (ref 11.7–15.4)
WBC: 7 10*3/uL (ref 3.4–10.8)

## 2022-03-14 LAB — URINALYSIS, ROUTINE W REFLEX MICROSCOPIC
Bilirubin, UA: NEGATIVE
Glucose, UA: NEGATIVE
Ketones, UA: NEGATIVE
Leukocytes,UA: NEGATIVE
Nitrite, UA: NEGATIVE
Protein,UA: NEGATIVE
Specific Gravity, UA: 1.025 (ref 1.005–1.030)
Urobilinogen, Ur: 1 mg/dL (ref 0.2–1.0)
pH, UA: 6 (ref 5.0–7.5)

## 2022-03-14 LAB — TSH: TSH: 2.77 u[IU]/mL (ref 0.450–4.500)

## 2022-03-14 LAB — MICROSCOPIC EXAMINATION
Bacteria, UA: NONE SEEN
WBC, UA: NONE SEEN /hpf (ref 0–5)

## 2022-05-28 ENCOUNTER — Ambulatory Visit (INDEPENDENT_AMBULATORY_CARE_PROVIDER_SITE_OTHER): Payer: 59 | Admitting: Nurse Practitioner

## 2022-07-16 ENCOUNTER — Ambulatory Visit: Payer: Self-pay

## 2022-07-23 ENCOUNTER — Encounter: Payer: Self-pay | Admitting: Family Medicine

## 2022-07-23 ENCOUNTER — Ambulatory Visit: Payer: Self-pay | Admitting: Family Medicine

## 2022-07-23 DIAGNOSIS — A64 Unspecified sexually transmitted disease: Secondary | ICD-10-CM

## 2022-07-23 LAB — HM HIV SCREENING LAB: HM HIV Screening: NEGATIVE

## 2022-07-23 LAB — HEPATITIS B SURFACE ANTIGEN: Hepatitis B Surface Ag: NONREACTIVE

## 2022-07-23 LAB — HM HEPATITIS C SCREENING LAB: HM Hepatitis Screen: NEGATIVE

## 2022-07-23 NOTE — Progress Notes (Signed)
Patient here for STD testing.Wet prep reviewed. No tx per standing orders. Condoms declined.

## 2022-07-23 NOTE — Progress Notes (Signed)
[facility] Department  STI clinic/screening visit [place] [address] [phone]  Subjective:  Marilyn Francis is a 41 y.o. female being seen today for an STI screening visit. The patient reports they do not have symptoms.  Patient reports that they do not desire a pregnancy in the next year.   They reported they are not interested in discussing contraception today.    Patient's last menstrual period was 07/21/2022 (exact date).   Patient has the following medical conditions:   Patient Active Problem List   Diagnosis Date Noted   Other fatigue 11/19/2021   Myalgia 11/19/2021   NSTEMI (non-ST elevated myocardial infarction) ([place]) 06/11/2021   Lower abdominal pain 06/11/2021   Chronic neck pain 12/17/2020   Chronic cough 11/05/2020   Irritable bowel syndrome with constipation 06/07/2020   Eczema 06/07/2020   Bipolar 2 disorder ([place])    Allergic rhinitis     Chief Complaint  Patient presents with   SEXUALLY TRANSMITTED DISEASE    Screening    HPI  Patient reports she is here for STD testing, denies symptoms.  Last HIV test per patient/review of record was 2/9/20234 Patient reports last pap was  08/02/2021  Screening for MPX risk: Does the patient have an unexplained rash? No Is the patient MSM? No Does the patient endorse multiple sex partners or anonymous sex partners? No Did the patient have close or sexual contact with a person diagnosed with MPX? No Has the patient traveled outside the [place] where MPX is endemic? No Is there a high clinical suspicion for MPX-- evidenced by one of the following No  -Unlikely to be chickenpox  -Lymphadenopathy  -Rash that present in same phase of evolution on any given body part See flowsheet for further details and programmatic requirements.   Immunization history:  Immunization History  Administered Date(s) Administered   DTaP 08/10/1980, 10/19/1980, 01/09/1981, 08/01/1982, 01/30/1986    IPV 08/10/1980, 10/19/1980, 01/09/1981, 08/01/1982, 01/30/1986   MMR 09/18/1981, 05/10/2009   PFIZER(Purple Top)SARS-COV-2 Vaccination 02/08/2020, 02/29/2020, 09/07/2020   Pfizer Covid-19 Vaccine Bivalent Booster 23yr & up 02/21/2022   Tdap 05/10/2009, 06/07/2020     The following portions of the patient's history were reviewed and updated as appropriate: allergies, current medications, past medical history, past social history, past surgical history and problem list.  Objective:  There were no vitals filed for this visit.  Physical Exam Constitutional:      Appearance: Normal appearance.  HENT:     Head: Normocephalic and atraumatic.  Pulmonary:     Effort: Pulmonary effort is normal.  Abdominal:     Palpations: Abdomen is soft.  Genitourinary:    Comments: [place] prep and ic deferred, client self-collected wet prep and GC/chlamydia. Musculoskeletal:        General: Normal range of motion.  Skin:    General: Skin is warm and dry.  Neurological:     General: No focal deficit present.     Mental Status: She is alert.  Psychiatric:        Mood and Affect: Mood normal.        Behavior: Behavior normal.      Assessment and Plan:  Marilyn Francis a 41y.o. female [facility]  1. Sexually transmissible disease  Treat wet prep as per SO. - Syphilis Serology, [place] [facility] - HIV [place] [facility] - WET PREP FOR [place], YEAST, CLUE - Chlamydia/Gonorrhea [place] [facility] - HIV/HCV  [place] [facility] - Hepatitis Serology, [place] [facility] - [place] [facility] to use dental dams, etc for STD prevention.     No follow-ups on file.  Future Appointments  Date Time Provider Drain  03/16/2023  1:00 PM Valerie Roys, DO [facility] [place], FNP

## 2022-07-24 LAB — WET PREP FOR TRICH, YEAST, CLUE
Trichomonas Exam: NEGATIVE
Yeast Exam: NEGATIVE

## 2022-08-18 NOTE — Addendum Note (Signed)
Addended by: Cletis Media on: 08/18/2022 12:01 PM   Modules accepted: Orders

## 2022-10-06 ENCOUNTER — Encounter (INDEPENDENT_AMBULATORY_CARE_PROVIDER_SITE_OTHER): Payer: Self-pay

## 2022-10-15 ENCOUNTER — Telehealth: Payer: Self-pay | Admitting: Internal Medicine

## 2022-10-15 NOTE — Telephone Encounter (Signed)
Preoperative team, we are not able to provide blanket coverage.  Please contact requesting office and ask about details surrounding surgery to be performed.  Once we know details about surgery we we will be able to provide recommendations from a cardiac standpoint.  Thank you for your help.  Jossie Ng. Jennel Mara NP-C     10/15/2022, [facility] [place] [address] Office [phone] Fax [phone]

## 2022-10-15 NOTE — Telephone Encounter (Signed)
   Pre-operative Risk Assessment    Patient Name: Marilyn Francis  DOB: [birth year] MRN: [id number]      Request for Surgical Clearance    Procedure:   Low risk cosmetic surgery lasting 31/2 to 4 hours  Date of Surgery:  Clearance 01/21/23                                 Surgeon:  Dr Winferd Humphrey Surgeon's Group or Practice Name:  [facility] Phone number:  [phone] Fax number:  [phone]   Type of Clearance Requested:   - Pharmacy:  Hold Aspirin instructions   Type of Anesthesia:  General    Additional requests/questions:    SignedAce Gins   10/15/2022, 2:53 PM

## 2022-10-16 NOTE — Telephone Encounter (Signed)
Left message to schedule an IN OFFICE APPT for pre op clearance. Appt will need to be some time after 01/21/23, as clearance notes are good for about 2 months. Pt can see Dr. Saunders Revel or APP.

## 2022-10-16 NOTE — Telephone Encounter (Signed)
Primary Cardiologist:Christopher End, MD  Chart reviewed as part of pre-operative protocol coverage. Because of Marilyn Francis's past medical history and time since last visit, he/she will require a follow-up visit in order to better assess preoperative cardiovascular risk.  Pre-op covering staff: - Please schedule appointment and call patient to inform them. - Please contact requesting surgeon's office via preferred method (i.e, phone, fax) to inform them of need for appointment prior to surgery.  Per office protocol, she may hold aspirin for 7 days prior to procedre.   Emmaline Life, NP-C  10/16/2022, 4:13 PM [address], [address] Office [phone] Fax [phone]

## 2022-10-16 NOTE — Telephone Encounter (Signed)
I s/w the requesting office and confirmed the procedure to be done.   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

## 2022-10-17 ENCOUNTER — Telehealth: Payer: Self-pay | Admitting: *Deleted

## 2022-10-17 NOTE — Telephone Encounter (Signed)
Patient would like a call back to explain the pre op clearance date. Said that she was confused on what the previous note was said. Please call back to discuss

## 2022-10-17 NOTE — Telephone Encounter (Signed)
[name], [name] minutes ago (8:56 AM)    Patient would like a call back to explain the pre op clearance date. Said that she was confused on what the previous note was said. Please call back to discuss      Note   I s/w the pt and she has been scheduled for 11/20/22 at this time with Melinda Crutch, NP for pre op clearance in the [place] office. Pt said she is going to call the surgeon office and see if her surgery may be pushed up sooner. Pt will call back if she needs to change her appt. I informed her that she can let the operator know she wants to move her appt up sooner and can see Dr. Saunders Revel or APP. Pt thanked me for the call and the help in this matter.

## 2022-11-20 ENCOUNTER — Ambulatory Visit: Payer: 59 | Admitting: Cardiology

## 2022-11-21 ENCOUNTER — Encounter: Payer: Self-pay | Admitting: Medical

## 2022-11-21 ENCOUNTER — Ambulatory Visit: Payer: Self-pay | Attending: Cardiology | Admitting: Medical

## 2022-11-21 VITALS — BP 120/60 | HR 59 | Ht 63.0 in | Wt 190.0 lb

## 2022-11-21 DIAGNOSIS — R072 Precordial pain: Secondary | ICD-10-CM

## 2022-11-21 DIAGNOSIS — R7989 Other specified abnormal findings of blood chemistry: Secondary | ICD-10-CM

## 2022-11-21 DIAGNOSIS — Z01810 Encounter for preprocedural cardiovascular examination: Secondary | ICD-10-CM

## 2022-11-21 DIAGNOSIS — R0789 Other chest pain: Secondary | ICD-10-CM

## 2022-11-21 NOTE — Progress Notes (Unsigned)
Cardiology Office Note:    Date:  11/26/2022   ID:  Marilyn Francis, DOB [birth year], MRN [id number]  PCP:  Valerie Roys, DO  [facility] Cardiologist:  Nelva Bush, MD  [facility] Electrophysiologist:  None   Referring MD: Valerie Roys, DO   Chief Complaint: pre-operative cardiac evaluation  History of Present Illness:    Marilyn Francis is a 41 y.o. female with a hx of bipolar disorder, COVID infection in 04/2021, IBS and allergic rhinitis who presents for follow-up.   She was previously evaluated by pulmonology in 2017 for dyspnea. Echo 09/2016 showed LVEF 60-65%, normal wall motion, normal diastolic function, mild MR, normal RVSF. Cardiopulmonary stress test showed no indicatio of cardiomulponary limitation or evidence of exercise induced asthma.   She was evaluated by Dr. Saunders Revel as a new patient on 06/12/2021 for chest pain and recent hospitalization in [place], [place] with demand ischemia at the request of her PCP.  She presented to outside hospital in [place] after having recently developed sudden onset of left lower back pain feeling that something "burst."  This was followed by the sensation of feeling swollen all over, chest discomfort, fatigue, and short of breath.  Work-up in the hospital was notable for elevated high-sensitivity troponin I which trended down throughout her hospital stay with an initial value of 205.4 (upper limit of normal 51.4).  CTA of the chest was negative for PE.  Echo showed preserved LVEF with no regional wall motion abnormalities.  It was felt her elevated troponin reflected demand ischemia in the context of recent COVID infection in 04/2021.  Lower extremity ultrasound was negative for DVT.  On establishing with our group on 7/6, she reported feeling much better with almost complete resolution of her symptoms, though did continue to note some mild vague chest discomfort that was atypical.  EKG was normal.  She did continue to have some low back  pain, for which she had undergone CT of the abdomen and pelvis the day prior which demonstrated punctate renal stones without evidence of obstruction as well as uterine fibroids.  She was empirically started on PPI and ASA 81 mg was added.  She subsequently underwent GXT on 06/25/2021 which showed good exercise tolerance, normal blood pressure response, and no evidence of ischemic changes.  Overall, this was a negative adequate exercise tolerance test. She was seen 06/2021 and a cardiac MRI was ordered to evaluate for myocarditis. Cardiac MRI showed LVEF 62%, normal T1 and T2, no myocarditis, normal valves, no pericardial effusion.   Today, the patient presents for cardiac clearance. She denies chest pain ,SOB, lower leg edema, orthopnea or pnd, lightheadedness dizziness or heart racing. She is active, does running. She drinks occasionally. She does not take Aspirin daily.   Past Medical History:  Diagnosis Date   Allergic rhinitis    Bipolar 2 disorder ([place])     Past Surgical History:  Procedure Laterality Date   birth mark removal     DENTAL SURGERY      Current Medications: Current Meds  Medication Sig   Calcium Carbonate-Vit D-Min (CALCIUM 1200 PO) Take 1 tablet by mouth daily.   cetirizine (ZYRTEC) 10 MG tablet Take 10 mg by mouth daily.   cholecalciferol (VITAMIN D) 1000 units tablet Take 1,000 Units by mouth daily.   ibuprofen (ADVIL,MOTRIN) 600 MG tablet Take 600 mg by mouth every 6 (six) hours as needed.   Magnesium 200 MG TABS Take 1 tablet by mouth daily.   Multiple Vitamin (MULTIVITAMIN)  tablet Take 1 tablet by mouth daily.   NASACORT ALLERGY 24HR 55 MCG/ACT AERO nasal inhaler 2 sprays daily.   triamcinolone ointment (KENALOG) 0.5 % Apply 1 application. topically 2 (two) times daily.     Allergies:   Patient has no known allergies.   Social History   Socioeconomic History   Marital status: Divorced    Spouse name: Not on file   Number of children: Not on file   Years of  education: Not on file   Highest education level: Not on file  Occupational History   Not on file  Tobacco Use   Smoking status: Never   Smokeless tobacco: Never  Vaping Use   Vaping Use: Never used  Substance and Sexual Activity   Alcohol use: Yes    Alcohol/week: 10.0 standard drinks of alcohol    Types: 10 Standard drinks or equivalent per week   Drug use: Not Currently    Types: Marijuana    Comment: last used 10/2021   Sexual activity: Yes    Birth control/protection: None, Condom  Other Topics Concern   Not on file  Social History Narrative   Not on file   Social Determinants of Health   Financial Resource Strain: Not on file  Food Insecurity: Not on file  Transportation Needs: Not on file  Physical Activity: Not on file  Stress: Not on file  Social Connections: Not on file     Family History: The patient's family history includes Breast cancer in her maternal grandmother; Cancer in her paternal grandfather; Heart disease in her paternal grandmother; Hodgkin's lymphoma in her brother; Hypertension in her father and mother; Lymphoma in her brother.  ROS:   Please see the history of present illness.     All other systems reviewed and are negative.  EKGs/Labs/Other Studies Reviewed:    The following studies were reviewed today:  Cardiac MRI 2022   IMPRESSION: 1. Normal LV size and function EF 62%   2. Normal T1/T2 with elevated ECV 52 large confidence interval not likely significant   3. No delayed gadolinium uptake to suggest myocarditis on delayed inversion recovery images   4.  Normal valves   5.  Normal aortic root 3.0 cm   6.  No pericardial effusion   7.  Normal RV size and function   [place]    Echo 2017 Study Conclusions   - Left ventricle: The cavity size was normal. Systolic function was    normal. The estimated ejection fraction was in the range of 60%    to 65%. Wall motion was normal; there were no regional wall    motion  abnormalities. Left ventricular diastolic function    parameters were normal.  - Mitral valve: There was mild regurgitation.  - Left atrium: The atrium was normal in size.  - Right ventricle: Systolic function was normal.  - Pulmonary arteries: Systolic pressure was within the normal    range.   EKG:  EKG is ordered today.  The ekg ordered today demonstrates SB, 59bpm, no ST/T wave changes  Recent Labs: 03/13/2022: ALT 6; BUN 11; Creatinine, Ser 0.87; Hemoglobin 12.5; Platelets 227; Potassium 4.3; Sodium 140; TSH 2.770  Recent Lipid Panel    Component Value Date/Time   CHOL 230 (H) 03/13/2022 1328   TRIG 75 03/13/2022 1328   HDL 105 03/13/2022 1328   LDLCALC 112 (H) 03/13/2022 1328     Physical Exam:    VS:  BP 120/60 (BP Location: Left Arm,  Patient Position: Sitting, Cuff Size: Large)   Pulse (!) 59   Ht '5\' 3"'$  (1.6 m)   Wt 190 lb (86.2 kg)   SpO2 98%   BMI 33.66 kg/m     Wt Readings from Last 3 Encounters:  11/21/22 190 lb (86.2 kg)  03/13/22 187 lb (84.8 kg)  02/17/22 189 lb (85.7 kg)     GEN:  Well nourished, well developed in no acute distress HEENT: Normal NECK: No JVD; No carotid bruits LYMPHATICS: No lymphadenopathy CARDIAC: RRR, no murmurs, rubs, gallops RESPIRATORY:  Clear to auscultation without rales, wheezing or rhonchi  ABDOMEN: Soft, non-tender, non-distended MUSCULOSKELETAL:  No edema; No deformity  SKIN: Warm and dry NEUROLOGIC:  Alert and oriented x 3 PSYCHIATRIC:  Normal affect   ASSESSMENT:    1. Preoperative cardiovascular examination    PLAN:    In order of problems listed above:  Pre-operative cardiac evaluation Plan to undergo elective plastic surgery. The patient is overall stable from a cardiac perspective. She denies chest pain, SOB, lower leg edema, orthopnea, pnd, palpitations, lightheadedness or dizziness. ETT 7/022 was negative. Cardiac MRI in 2022 showed normal LVEF 62%, normal valves, normal aortic root. The patient does not  take Aspirin daily. EKG today shows sinus bradycardia with no ST/T wave changes. She does daily exercise. METS>4. According to the RCRI she is class 1 risk at 3.9% 30 day risk of death, MI, or cardiac arrest. Overall, it is OK to undergo surgery without further cardiac evaluation or testing.  Disposition: Follow up prn with MD/APP    Signed, Daniesha Driver Ninfa Meeker, PA-C  11/26/2022 8:38 AM    [facility]

## 2022-11-21 NOTE — Patient Instructions (Signed)
Medication Instructions:   NONE  *If you need a refill on your cardiac medications before your next appointment, please call your pharmacy*   Lab Work:  NONE  If you have labs (blood work) drawn today and your tests are completely normal, you will receive your results only by: [place] (if you have MyChart) OR A paper copy in the mail If you have any lab test that is abnormal or we need to change your treatment, we will call you to review the results.   Testing/Procedures:  NONE   Follow-Up: At [facility], you and your health needs are our priority.  As part of our continuing mission to provide you with exceptional heart care, we have created designated Provider Care Teams.  These Care Teams include your primary Cardiologist (physician) and Advanced Practice Providers (APPs -  Physician Assistants and Nurse Practitioners) who all work together to provide you with the care you need, when you need it.  We recommend signing up for the patient portal called "MyChart".  Sign up information is provided on this After Visit Summary.  MyChart is used to connect with patients for Virtual Visits (Telemedicine).  Patients are able to view lab/test results, encounter notes, upcoming appointments, etc.  Non-urgent messages can be sent to your provider as well.   To learn more about what you can do with MyChart, go to [email or web address].    Your next appointment:    Follow up as needed   Important Information About Sugar

## 2023-03-16 ENCOUNTER — Encounter: Payer: Self-pay | Admitting: Family Medicine

## 2023-08-04 ENCOUNTER — Ambulatory Visit: Payer: Self-pay | Admitting: Family Medicine

## 2023-08-04 DIAGNOSIS — Z113 Encounter for screening for infections with a predominantly sexual mode of transmission: Secondary | ICD-10-CM

## 2023-08-04 LAB — WET PREP FOR TRICH, YEAST, CLUE
Trichomonas Exam: NEGATIVE
Yeast Exam: NEGATIVE

## 2023-08-04 LAB — HM HIV SCREENING LAB: HM HIV Screening: NEGATIVE

## 2023-08-04 NOTE — Progress Notes (Signed)
 Pt is here for STD visit.  Wet prep results reviewed with pt, no treatment required per standing order.  Condoms declined.  Gaspar Garbe, RN

## 2023-08-04 NOTE — Progress Notes (Signed)
[facility] Department  STI clinic/screening visit [address] [place] [address] [phone]  Subjective:  Marilyn Francis is a 42 y.o. female being seen today for an STI screening visit. The patient reports they do not have symptoms.  Patient reports that they do not desire a pregnancy in the next year.   They reported they are not interested in discussing contraception today.    No LMP recorded.  Patient has the following medical conditions:   Patient Active Problem List   Diagnosis Date Noted   Other fatigue 11/19/2021   Myalgia 11/19/2021   NSTEMI (non-ST elevated myocardial infarction) (HCC) 06/11/2021   Lower abdominal pain 06/11/2021   Chronic neck pain 12/17/2020   Chronic cough 11/05/2020   Irritable bowel syndrome with constipation 06/07/2020   Eczema 06/07/2020   Bipolar 2 disorder (HCC)    Allergic rhinitis     Chief Complaint  Patient presents with   SEXUALLY TRANSMITTED DISEASE    HPI  Patient reports to clinic for STI testing. Asymptomatic  Does the patient using douching products? Practitioner oversight- forgot to ask  Last HIV test per patient/review of record was  Lab Results  Component Value Date   HMHIVSCREEN Negative - Validated 07/23/2022    Lab Results  Component Value Date   HIV Non Reactive 06/07/2020   Patient reports last pap was  Lab Results  Component Value Date   DIAGPAP  08/02/2021    - Negative for intraepithelial lesion or malignancy (NILM)    Lab Results  Component Value Date   SPECADGYN Comment 03/30/2017    Screening for MPX risk: Does the patient have an unexplained rash? No Is the patient MSM? No Does the patient endorse multiple sex partners or anonymous sex partners? No Did the patient have close or sexual contact with a person diagnosed with MPX? No Has the patient traveled outside the [place] where MPX is endemic? No Is there a high clinical suspicion for MPX-- evidenced by one of the  following No  -Unlikely to be chickenpox  -Lymphadenopathy  -Rash that present in same phase of evolution on any given body part See flowsheet for further details and programmatic requirements.   Immunization history:  Immunization History  Administered Date(s) Administered   DTaP 08/10/1980, 10/19/1980, 01/09/1981, 08/01/1982, 01/30/1986   IPV 08/10/1980, 10/19/1980, 01/09/1981, 08/01/1982, 01/30/1986   MMR 09/18/1981, 05/10/2009   PFIZER(Purple Top)SARS-COV-2 Vaccination 02/08/2020, 02/29/2020, 09/07/2020   Pfizer Covid-19 Vaccine Bivalent Booster 56yrs & up 02/21/2022   Tdap 05/10/2009, 06/07/2020     The following portions of the patient's history were reviewed and updated as appropriate: allergies, current medications, past medical history, past social history, past surgical history and problem list.  Objective:  There were no vitals filed for this visit.  Physical Exam   Assessment and Plan:  Marilyn Francis is a 42 y.o. female presenting to the [facility] Department for STI screening  1. Screening for venereal disease  - Chlamydia/Gonorrhea [place] [facility] - HIV [place] [facility] - Syphilis Serology, [place] [facility] - WET PREP FOR TRICH, YEAST, CLUE   Patient accepted all screenings including , vaginal CT/GC and bloodwork for HIV/RPR, and wet prep. Patient meets criteria for HepB screening? No. Ordered? not applicable Patient meets criteria for HepC screening? No. Ordered? not applicable  Treat wet prep per standing order Discussed time line for [facility] results and that patient will be called with positive results and encouraged patient to call if she had  not heard in 2 weeks.  Counseled to return or seek care for continued or worsening symptoms Recommended repeat testing in 3 months with positive results. Recommended condom use with all sex  Patient is currently using  nothing  to prevent pregnancy.    Return if symptoms worsen or fail to improve, for STI  screening.  No future appointments. Total time spent 20 minutes Lenice Llamas, [place]

## 2024-07-27 ENCOUNTER — Ambulatory Visit: Payer: Self-pay
